# Patient Record
Sex: Male | Born: 1985 | Hispanic: No | Marital: Married | Smoking: Never smoker
Health system: Southern US, Community
[De-identification: ages and names within clinical notes are randomized; demographics above are authoritative.]

## PROBLEM LIST (undated history)

## (undated) DIAGNOSIS — K644 Residual hemorrhoidal skin tags: Secondary | ICD-10-CM

## (undated) HISTORY — DX: Residual hemorrhoidal skin tags: K64.4

---

## 2018-10-28 DIAGNOSIS — M62830 Muscle spasm of back: Secondary | ICD-10-CM | POA: Insufficient documentation

## 2018-12-07 ENCOUNTER — Emergency Department (HOSPITAL_COMMUNITY)
Admission: EM | Admit: 2018-12-07 | Discharge: 2018-12-07 | Disposition: A | Discharge status: Home / Self Care | Payer: Self-pay | Attending: Emergency Medicine | Admitting: Emergency Medicine

## 2018-12-07 DIAGNOSIS — R1013 Epigastric pain: Secondary | ICD-10-CM | POA: Insufficient documentation

## 2018-12-07 DIAGNOSIS — K644 Residual hemorrhoidal skin tags: Secondary | ICD-10-CM | POA: Insufficient documentation

## 2018-12-07 DIAGNOSIS — K625 Hemorrhage of anus and rectum: Secondary | ICD-10-CM | POA: Insufficient documentation

## 2018-12-07 LAB — CBC WITH DIFFERENTIAL/PLATELET
Abs Immature Granulocytes: 0.01 10*3/uL (ref 0.00–0.07)
Basophils Absolute: 0 10*3/uL (ref 0.0–0.1)
Basophils Relative: 1 %
Eosinophils Absolute: 0 10*3/uL (ref 0.0–0.5)
Eosinophils Relative: 1 %
HCT: 43.6 % (ref 39.0–52.0)
Hemoglobin: 14.3 g/dL (ref 13.0–17.0)
Immature Granulocytes: 0 %
Lymphocytes Relative: 26 %
Lymphs Abs: 1.5 10*3/uL (ref 0.7–4.0)
MCH: 29.8 pg (ref 26.0–34.0)
MCHC: 32.8 g/dL (ref 30.0–36.0)
MCV: 90.8 fL (ref 80.0–100.0)
Monocytes Absolute: 0.4 10*3/uL (ref 0.1–1.0)
Monocytes Relative: 6 %
Neutro Abs: 3.8 10*3/uL (ref 1.7–7.7)
Neutrophils Relative %: 66 %
Platelets: 195 10*3/uL (ref 150–400)
RBC: 4.8 MIL/uL (ref 4.22–5.81)
RDW: 12 % (ref 11.5–15.5)
WBC: 5.8 10*3/uL (ref 4.0–10.5)
nRBC: 0 % (ref 0.0–0.2)

## 2018-12-07 LAB — COMPREHENSIVE METABOLIC PANEL
ALT: 41 U/L (ref 0–44)
ANION GAP: 9 (ref 5–15)
AST: 28 U/L (ref 15–41)
Albumin: 4 g/dL (ref 3.5–5.0)
Alkaline Phosphatase: 59 U/L (ref 38–126)
BUN: 10 mg/dL (ref 6–20)
CO2: 25 mmol/L (ref 22–32)
Calcium: 9.3 mg/dL (ref 8.9–10.3)
Chloride: 104 mmol/L (ref 98–111)
Creatinine, Ser: 1.2 mg/dL (ref 0.61–1.24)
GFR calc Af Amer: >60 mL/min (ref >60)
GFR calc non Af Amer: >60 mL/min (ref >60)
Glucose, Bld: 126 mg/dL — ABNORMAL HIGH (ref 70–99)
Potassium: 3.7 mmol/L (ref 3.5–5.1)
SODIUM: 138 mmol/L (ref 135–145)
TOTAL PROTEIN: 6.8 g/dL (ref 6.5–8.1)
Total Bilirubin: 0.9 mg/dL (ref 0.3–1.2)

## 2018-12-07 LAB — POC OCCULT BLOOD, ED: Fecal Occult Bld: POSITIVE — AB

## 2018-12-07 LAB — TYPE AND SCREEN
ABO/RH(D): B POS
Antibody Screen: NEGATIVE

## 2018-12-07 LAB — ABO/RH: ABO/RH(D): B POS

## 2018-12-07 MED ORDER — PANTOPRAZOLE SODIUM 20 MG PO TBEC: 20.0000 mg | DELAYED_RELEASE_TABLET | Freq: Two times a day (BID) | ORAL | 0 refills | Status: AC

## 2018-12-07 NOTE — Discharge Instructions (Addendum)
It was my pleasure taking care of you today!   Please call the GI clinic listed first thing on Monday morning to schedule a follow-up appointment.

## 2018-12-07 NOTE — ED Provider Notes (Signed)
[facility] Provider Note   CSN: [id number] Arrival date & time: 12/07/18  1408     History   Chief Complaint Chief Complaint  Patient presents with  . GI Bleeding    HPI [facility] is a 33 y.o. male.  The history is provided by the patient and medical records. No language interpreter was used.   [facility] is a 33 y.o. male who presents to the Emergency Department complaining of bright red blood with bowel movements which began 4 to 5 days ago.  Patient stated that he initially high bright red blood that filled the toilet with a bowel movement on Tuesday.  He did not have any further bleeding until he had another bowel movement.  This morning, he had a third bowel movement which also had bright red blood and bleeding that filled the toilet.  He denies any bleeding outside of bowel movements.  History of similar a few years ago where he saw an urgent care and was given Preparation H cream to place around his rectum.  He did this and everything resolved.  He has never seen a GI doctor.  He tried Preparation H cream this episode, however with no relief.  He does report feeling a little dizzy when he stands up over the last 2 days.  No syncopal episodes.  No headache or visual changes.  He denies alcohol use.  No frequent NSAID use.  He does report epigastric pain without nausea or vomiting.  No past medical history on file.  There are no active problems to display for this patient.      Home Medications    Prior to Admission medications   Medication Sig Start Date End Date Taking? Authorizing Provider  pantoprazole (PROTONIX) 20 MG tablet Take 1 tablet (20 mg total) by mouth 2 (two) times daily. 12/07/18   , Chase Picket, PA-C    Family History No family history on file.  Social History Social History   Tobacco Use  . Smoking status: Not on file  Substance Use Topics  . Alcohol use: Not on file  . Drug  use: Not on file     Allergies   Patient has no allergy information on record.   Review of Systems Review of Systems  Gastrointestinal: Positive for abdominal pain and blood in stool. Negative for constipation, diarrhea, nausea, rectal pain and vomiting.  All other systems reviewed and are negative.    Physical Exam Updated Vital Signs BP (!) 133/94 (BP Location: Right Arm)   Pulse 70   Temp 98.2 F (36.8 C) (Oral)   Resp 18   Ht 5\' 9"  (1.753 m)   Wt 81.2 kg   SpO2 100%   BMI 26.43 kg/m   Physical Exam Vitals signs and nursing note reviewed.  Constitutional:      General: He is not in acute distress.    Appearance: He is well-developed.  HENT:     Head: Normocephalic and atraumatic.  Neck:     Musculoskeletal: Neck supple.  Cardiovascular:     Rate and Rhythm: Normal rate and regular rhythm.     Heart sounds: Normal heart sounds. No murmur.  Pulmonary:     Effort: Pulmonary effort is normal. No respiratory distress.     Breath sounds: Normal breath sounds.  Abdominal:     General: There is no distension.     Palpations: Abdomen is soft.     Comments: Tenderness to palpation of  the epigastrium without rebound or guarding. Negative Murphy's.   Genitourinary:    Comments: External hemorrhoids without tenderness.  No active bleeding on exam. Skin:    General: Skin is warm and dry.  Neurological:     Mental Status: He is alert and oriented to person, place, and time.      ED Treatments / Results  Labs (all labs ordered are listed, but only abnormal results are displayed) Labs Reviewed  COMPREHENSIVE METABOLIC PANEL - Abnormal; Notable for the following components:      Result Value   Glucose, Bld 126 (*)    All other components within normal limits  POC OCCULT BLOOD, ED - Abnormal; Notable for the following components:   Fecal Occult Bld POSITIVE (*)    All other components within normal limits  CBC WITH DIFFERENTIAL/PLATELET  OCCULT BLOOD X 1 CARD TO  LAB, STOOL  TYPE AND SCREEN  ABO/RH    EKG None  Radiology No results found.  Procedures Procedures (including critical care time)  Medications Ordered in ED Medications - No data to display   Initial Impression / Assessment and Plan / ED Course  I have reviewed the triage vital signs and the nursing notes.  Pertinent labs & imaging results that were available during my care of the patient were reviewed by me and considered in my medical decision making (see chart for details).    [facility] is a 33 y.o. male who presents to ED for bright red blood with bowel movements for the last several days.  On exam, patient is afebrile, hemodynamically stable with normal orthostatic vitals.  He does have some tenderness to the epigastrium, but no rebound or guarding.  Negative Murphy sign.  No active bleeding on rectal exam.  Hemoccult positive.  Hemoglobin is 14.3.  Patient appears stable for further work-up of his rectal bleeding as an outpatient.  Will refer to GI.  Strict return precautions were discussed with both patient and family.  All questions answered.  Patient discussed with Dr. Madilyn Hook who agrees with treatment plan.    Final Clinical Impressions(s) / ED Diagnoses   Final diagnoses:  Bright red blood per rectum  Epigastric pain    ED Discharge Orders         Ordered    pantoprazole (PROTONIX) 20 MG tablet  2 times daily     12/07/18 1538           , Chase Picket, PA-C 12/07/18 1556    Tilden Fossa, MD 12/09/18 [phone]

## 2018-12-07 NOTE — ED Notes (Signed)
Patient completed orthostatic vitals but complained of dizziness after standing for two minutes.

## 2018-12-07 NOTE — ED Triage Notes (Signed)
Pt reports that he has been having blood with bowel movements since Tuesday. Pt is also complaining of abdominal tenderness and some dizziness.

## 2018-12-07 NOTE — ED Notes (Signed)
Pt given discharge instructions. Pt verbalized understanding. Pt given prescription. Prescription reviewed with patient.

## 2019-05-29 ENCOUNTER — Emergency Department (HOSPITAL_COMMUNITY): Payer: BLUE CROSS/BLUE SHIELD

## 2019-05-29 ENCOUNTER — Other Ambulatory Visit: Payer: Self-pay

## 2019-05-29 ENCOUNTER — Encounter (HOSPITAL_COMMUNITY): Payer: Self-pay | Admitting: Emergency Medicine

## 2019-05-29 ENCOUNTER — Emergency Department (HOSPITAL_COMMUNITY)
Admission: EM | Admit: 2019-05-29 | Discharge: 2019-05-29 | Disposition: A | Discharge status: Home / Self Care | Payer: BLUE CROSS/BLUE SHIELD | Attending: Emergency Medicine | Admitting: Emergency Medicine

## 2019-05-29 DIAGNOSIS — R110 Nausea: Secondary | ICD-10-CM | POA: Diagnosis not present

## 2019-05-29 DIAGNOSIS — R51 Headache: Secondary | ICD-10-CM | POA: Insufficient documentation

## 2019-05-29 DIAGNOSIS — U071 COVID-19: Secondary | ICD-10-CM

## 2019-05-29 DIAGNOSIS — R0602 Shortness of breath: Secondary | ICD-10-CM | POA: Diagnosis not present

## 2019-05-29 DIAGNOSIS — Z20828 Contact with and (suspected) exposure to other viral communicable diseases: Secondary | ICD-10-CM | POA: Insufficient documentation

## 2019-05-29 LAB — CBC WITH DIFFERENTIAL/PLATELET
Abs Immature Granulocytes: 0.04 10*3/uL (ref 0.00–0.07)
Basophils Absolute: 0 10*3/uL (ref 0.0–0.1)
Basophils Relative: 0 %
Eosinophils Absolute: 0.1 10*3/uL (ref 0.0–0.5)
Eosinophils Relative: 1 %
HCT: 44.8 % (ref 39.0–52.0)
Hemoglobin: 15 g/dL (ref 13.0–17.0)
Immature Granulocytes: 0 %
Lymphocytes Relative: 20 %
Lymphs Abs: 1.8 10*3/uL (ref 0.7–4.0)
MCH: 30.3 pg (ref 26.0–34.0)
MCHC: 33.5 g/dL (ref 30.0–36.0)
MCV: 90.5 fL (ref 80.0–100.0)
Monocytes Absolute: 0.5 10*3/uL (ref 0.1–1.0)
Monocytes Relative: 6 %
Neutro Abs: 6.9 10*3/uL (ref 1.7–7.7)
Neutrophils Relative %: 73 %
Platelets: 219 10*3/uL (ref 150–400)
RBC: 4.95 MIL/uL (ref 4.22–5.81)
RDW: 12.4 % (ref 11.5–15.5)
WBC: 9.4 10*3/uL (ref 4.0–10.5)
nRBC: 0 % (ref 0.0–0.2)

## 2019-05-29 LAB — COMPREHENSIVE METABOLIC PANEL
ALT: 41 U/L (ref 0–44)
AST: 27 U/L (ref 15–41)
Albumin: 4.5 g/dL (ref 3.5–5.0)
Alkaline Phosphatase: 63 U/L (ref 38–126)
Anion gap: 10 (ref 5–15)
BUN: 13 mg/dL (ref 6–20)
CO2: 25 mmol/L (ref 22–32)
Calcium: 9.9 mg/dL (ref 8.9–10.3)
Chloride: 103 mmol/L (ref 98–111)
Creatinine, Ser: 0.96 mg/dL (ref 0.61–1.24)
GFR calc Af Amer: >60 mL/min (ref >60)
GFR calc non Af Amer: >60 mL/min (ref >60)
Glucose, Bld: 103 mg/dL — ABNORMAL HIGH (ref 70–99)
Potassium: 4.3 mmol/L (ref 3.5–5.1)
Sodium: 138 mmol/L (ref 135–145)
Total Bilirubin: 0.8 mg/dL (ref 0.3–1.2)
Total Protein: 7.8 g/dL (ref 6.5–8.1)

## 2019-05-29 IMAGING — DX PORTABLE CHEST - 1 VIEW
1 series · 1 of 1 positions shown · non-contrast
Comparison: None.

CLINICAL DATA: Shortness of breath and fever.  Fatigue.

EXAM:
PORTABLE CHEST 1 VIEW

[chest ap]
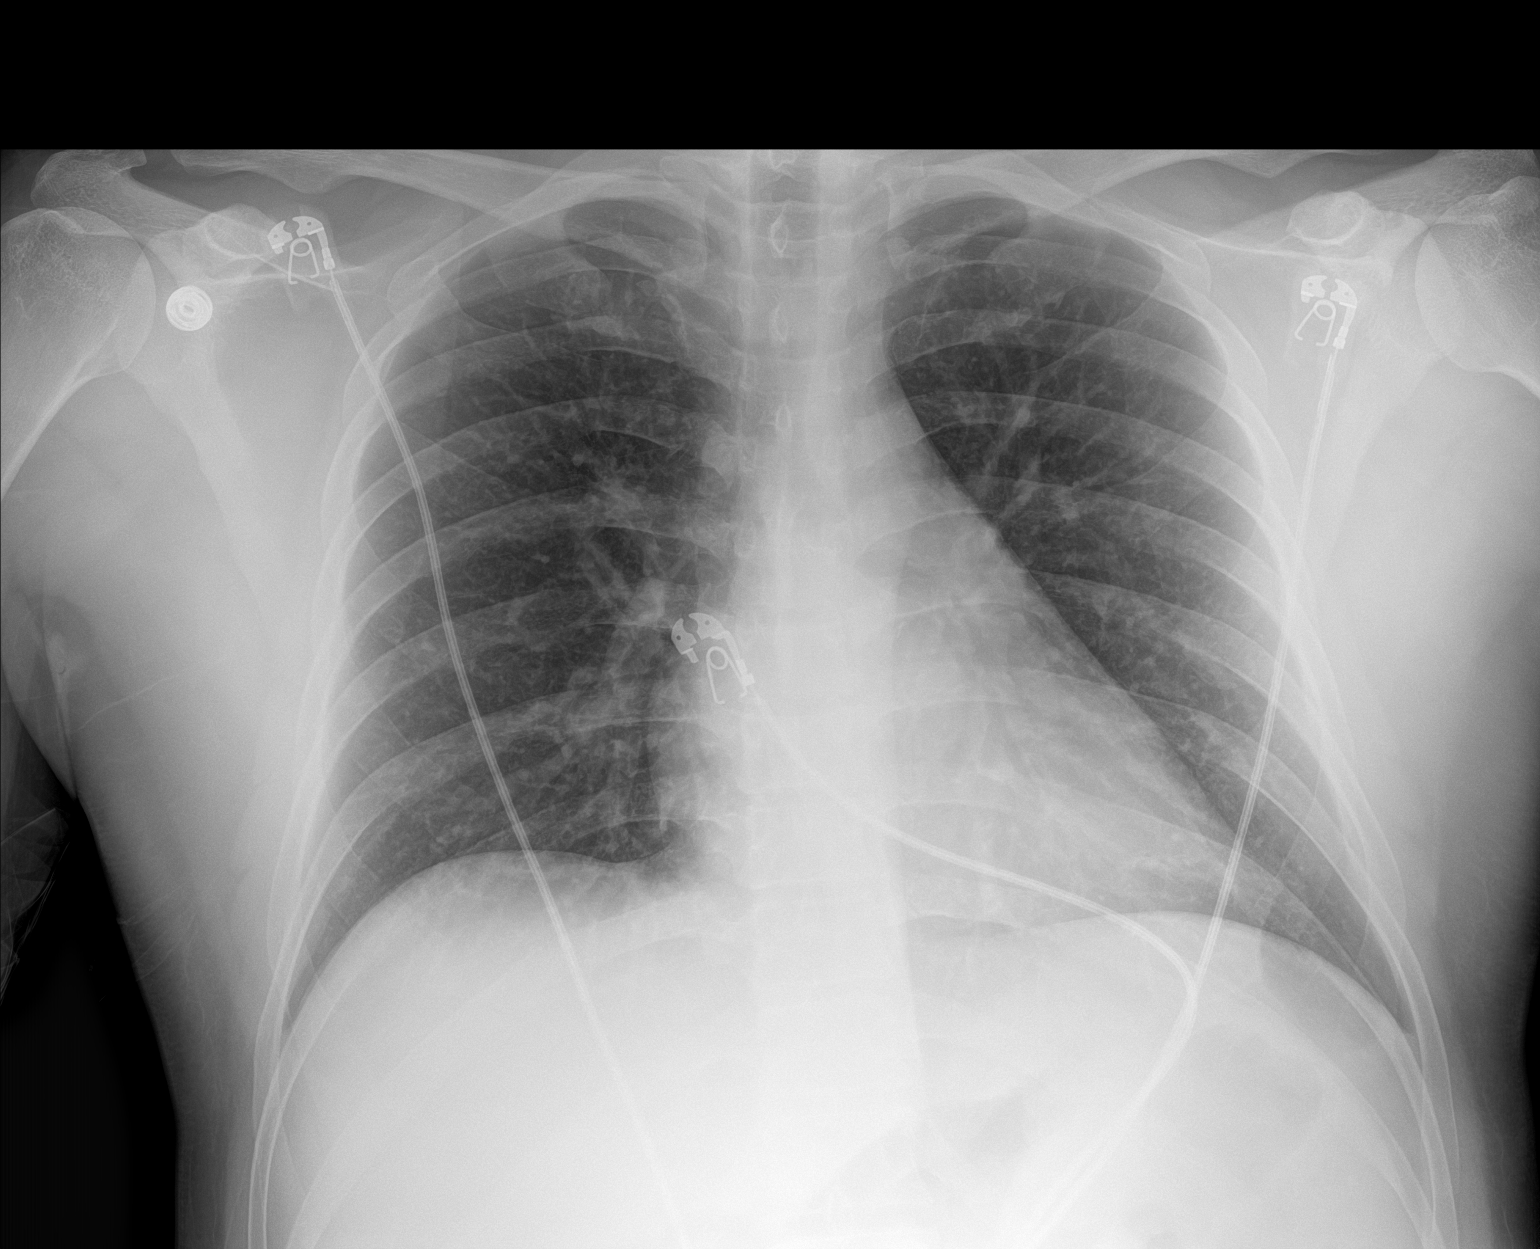

[1 of 1 positions shown; findings below may reference images not displayed]

FINDINGS: The heart size and mediastinal contours are within normal limits.
Both lungs are clear. The visualized skeletal structures are
unremarkable.
IMPRESSION: No active disease.

## 2019-05-29 MED ORDER — ACETAMINOPHEN 325 MG PO TABS
650.0000 mg | ORAL_TABLET | Freq: Once | ORAL | Status: AC
  Administered 2019-05-29: 650 mg via ORAL
  Filled 2019-05-29: qty 2

## 2019-05-29 NOTE — ED Provider Notes (Signed)
[facility] Provider Note   CSN: [id number] Arrival date & time: 05/29/19  5397    History   Chief Complaint Chief Complaint  Patient presents with  . COVID  . Shortness of Breath    HPI Mukund Rowen Wilmer is a 33 y.o. male.     33 y.o male with no PMH presents to the ED with a chief complaint of shortness of breath, headache, myalgias x today. Patient reports he was at work when the symptoms began, he also endorses nausea along with feeling overall weak. He also reported shortness of breath with sudden onset. Patient is currently employed in [employer] at a [employer]. He report having his temperature recorded with a Tmax of 100. He denies any headache, chest pain or other complaints.   The history is provided by the patient.  Shortness of Breath Associated symptoms: no abdominal pain, no chest pain, no fever and no wheezing      Home Medications    Prior to Admission medications   Medication Sig Start Date End Date Taking? Authorizing Provider  pantoprazole (PROTONIX) 20 MG tablet Take 1 tablet (20 mg total) by mouth 2 (two) times daily. 12/07/18   Ward, Ozella Almond, PA-C    Family History No family history on file.  Social History Social History   Tobacco Use  . Smoking status: Not on file  Substance Use Topics  . Alcohol use: Not on file  . Drug use: Not on file     Allergies   Patient has no known allergies.   Review of Systems Review of Systems  Constitutional: Negative for fever.  Respiratory: Positive for shortness of breath. Negative for wheezing.   Cardiovascular: Negative for chest pain.  Gastrointestinal: Negative for abdominal pain.  Musculoskeletal: Positive for myalgias.     Physical Exam Updated Vital Signs BP 123/89   Pulse 66   Temp 98.5 F (36.9 C) (Oral)   Resp 16   SpO2 99%   Physical Exam Vitals signs and nursing note reviewed.  Constitutional:    Appearance: He is well-developed.     Comments: No ill appearing.   HENT:     Head: Normocephalic and atraumatic.  Eyes:     General: No scleral icterus.    Pupils: Pupils are equal, round, and reactive to light.  Neck:     Musculoskeletal: Normal range of motion.  Cardiovascular:     Heart sounds: Normal heart sounds.  Pulmonary:     Effort: Pulmonary effort is normal.     Breath sounds: Normal breath sounds. No wheezing, rhonchi or rales.     Comments: No wheezing, no rhonchi, no rales.  Chest:     Chest wall: No tenderness.  Abdominal:     General: Bowel sounds are normal. There is no distension.     Palpations: Abdomen is soft.     Tenderness: There is no abdominal tenderness.  Musculoskeletal:        General: No tenderness or deformity.  Skin:    General: Skin is warm and dry.  Neurological:     Mental Status: He is alert and oriented to person, place, and time.      ED Treatments / Results  Labs (all labs ordered are listed, but only abnormal results are displayed) Labs Reviewed  COMPREHENSIVE METABOLIC PANEL - Abnormal; Notable for the following components:      Result Value   Glucose, Bld 103 (*)    All other  components within normal limits  NOVEL CORONAVIRUS, NAA (HOSPITAL ORDER, SEND-OUT TO REF LAB)  CBC WITH DIFFERENTIAL/PLATELET    EKG None  Radiology Dg Chest Portable 1 View  Result Date: 05/29/2019 CLINICAL DATA:  Shortness of breath and fever.  Fatigue. EXAM: PORTABLE CHEST 1 VIEW COMPARISON:  None. FINDINGS: The heart size and mediastinal contours are within normal limits. Both lungs are clear. The visualized skeletal structures are unremarkable. IMPRESSION: No active disease. Electronically Signed   By: Paulina FusiMark  Shogry M.D.   On: 05/29/2019 20:15    Procedures Procedures (including critical care time)  Medications Ordered in ED Medications  acetaminophen (TYLENOL) tablet 650 mg (650 mg Oral Given 05/29/19 2011)     Initial Impression /  Assessment and Plan / ED Course  I have reviewed the triage vital signs and the nursing notes.  Pertinent labs & imaging results that were available during my care of the patient were reviewed by me and considered in my medical decision making (see chart for details).      Patient with no PMH presents to the ED with complaints of shortness of breath, headache, nausea which suddenly began around 4 Pm he was at work, patient is currently employed at a [employer].  Reports he had a temperature measure with a T-max of 100.  Upon arrival patient arrived in the ED febrile at 101 F, oxygen saturation at 100% on RA, no acute respiratory distress.  CBC with no leukocytosis, hemoglobin 15. CMP showed no electrolyte abnormality. Creatine level is within normal limits. LFT's with no unremarkable changes.  Xray showed no No active disease. Covid 19 test obtained as patient symptoms are consistent with likely a covid 19 infection.  10:52 PM Patient reevaluated by me, he is afebrile, reports he is feeling better with tylenol will obtain swab for send out. Patient is advised to quarantine while at his test results return. Vitals within normal limits, xray without any pneumonia. Return precautions discussed at length.     Raheem Birdie SonsBahdadur Ehlert was evaluated in Emergency Department on 05/29/2019 for the symptoms described in the history of present illness. He was evaluated in the context of the global COVID-19 pandemic, which necessitated consideration that the patient might be at risk for infection with the SARS-CoV-2 virus that causes COVID-19. Institutional protocols and algorithms that pertain to the evaluation of patients at risk for COVID-19 are in a state of rapid change based on information released by regulatory bodies including the CDC and federal and state organizations. These policies and algorithms were followed during the patient's care in the ED.  Portions of this note were generated  with Scientist, clinical (histocompatibility and immunogenetics)Dragon dictation software. Dictation errors may occur despite best attempts at proofreading.    Final Clinical Impressions(s) / ED Diagnoses   Final diagnoses:  Shortness of breath  COVID-19    ED Discharge Orders    None       Claude MangesSoto, Dimetri Armitage, PA-C 05/29/19 2255    Sabas SousBero, Michael M, MD 06/02/19 [phone]

## 2019-05-29 NOTE — ED Triage Notes (Signed)
Pt works in [employer]. PT has fatigue, shortness of breath and nausea with a fever. Speaks nepali, interpreter used. No med hx of allergies.

## 2019-05-29 NOTE — Discharge Instructions (Addendum)
Today you were tested for Covid 19 if your results are positive you will be called, please take tylenol to help with your fever. Your laboratory results along with your chest xray were within normal limits. Please return if you experience any worsening symptoms or shortness of breath.

## 2019-05-31 LAB — NOVEL CORONAVIRUS, NAA (HOSP ORDER, SEND-OUT TO REF LAB; TAT 18-24 HRS): SARS-CoV-2, NAA: NOT DETECTED

## 2019-06-18 ENCOUNTER — Other Ambulatory Visit: Payer: Self-pay

## 2019-06-18 DIAGNOSIS — Z20822 Contact with and (suspected) exposure to covid-19: Secondary | ICD-10-CM

## 2019-06-20 LAB — NOVEL CORONAVIRUS, NAA: SARS-CoV-2, NAA: NOT DETECTED

## 2019-07-30 ENCOUNTER — Other Ambulatory Visit: Payer: Self-pay

## 2019-07-30 ENCOUNTER — Encounter (HOSPITAL_COMMUNITY): Payer: Self-pay

## 2019-07-30 ENCOUNTER — Ambulatory Visit (HOSPITAL_COMMUNITY)
Admission: EM | Admit: 2019-07-30 | Discharge: 2019-07-30 | Disposition: A | Discharge status: Home / Self Care | Payer: BLUE CROSS/BLUE SHIELD | Attending: Family Medicine | Admitting: Family Medicine

## 2019-07-30 DIAGNOSIS — M5442 Lumbago with sciatica, left side: Secondary | ICD-10-CM

## 2019-07-30 DIAGNOSIS — R202 Paresthesia of skin: Secondary | ICD-10-CM

## 2019-07-30 DIAGNOSIS — R200 Anesthesia of skin: Secondary | ICD-10-CM

## 2019-07-30 MED ORDER — CYCLOBENZAPRINE HCL 5 MG PO TABS: 5.0000 mg | ORAL_TABLET | Freq: Every day | ORAL | 0 refills | Status: DC

## 2019-07-30 MED ORDER — GABAPENTIN 100 MG PO CAPS: 100.0000 mg | ORAL_CAPSULE | Freq: Three times a day (TID) | ORAL | 0 refills | Status: DC

## 2019-07-30 MED ORDER — METHYLPREDNISOLONE SODIUM SUCC 125 MG IJ SOLR
125.0000 mg | Freq: Once | INTRAMUSCULAR | Status: AC
  Administered 2019-07-30: 125 mg via INTRAMUSCULAR

## 2019-07-30 MED ORDER — MELOXICAM 7.5 MG PO TABS: 7.5000 mg | ORAL_TABLET | Freq: Every day | ORAL | 0 refills | Status: DC

## 2019-07-30 MED ORDER — METHYLPREDNISOLONE SODIUM SUCC 125 MG IJ SOLR
INTRAMUSCULAR | Status: AC
  Filled 2019-07-30: qty 2

## 2019-07-30 NOTE — ED Provider Notes (Signed)
  MRN: [id number] DOB: 26-Aug-1986  Subjective:   [facility] is a 33 y.o. male presenting for 1 day history of acute onset moderate-severe worsening constant shooting pain of left low back/buttock/hip. It started shooting down his left thigh into his foot and had associated numbness and tingling. He was at work when it started hurting, is working at [employer]. No inciting events, falls or trauma. His work is strenuous however.  Reports history of similar episode a few years ago, had to stay out of work for 2 weeks.  No current facility-administered medications for this encounter.   Current Outpatient Medications:  .  pantoprazole (PROTONIX) 20 MG tablet, Take 1 tablet (20 mg total) by mouth 2 (two) times daily., Disp: 30 tablet, Rfl: 0   No Known Allergies  History reviewed. No pertinent past medical history.   History reviewed. No pertinent surgical history.  ROS A fever, difficulty urinating, inability to defecate, incontinence, hematuria, weakness.  Objective:   Vitals: BP 127/80 (BP Location: Right Arm)   Pulse 70   Temp 98.3 F (36.8 C) (Oral)   Resp 18   Wt 182 lb (82.6 kg)   SpO2 100%   BMI 26.88 kg/m   Physical Exam Constitutional:      Appearance: Normal appearance. He is well-developed and normal weight.  HENT:     Head: Normocephalic and atraumatic.     Right Ear: External ear normal.     Left Ear: External ear normal.     Nose: Nose normal.     Mouth/Throat:     Pharynx: Oropharynx is clear.  Eyes:     Extraocular Movements: Extraocular movements intact.     Pupils: Pupils are equal, round, and reactive to light.  Cardiovascular:     Rate and Rhythm: Normal rate.  Pulmonary:     Effort: Pulmonary effort is normal.  Musculoskeletal:     Lumbar back: He exhibits decreased range of motion (Flexion, extension severely limited), tenderness (Positive straight leg raise to the left, area outlined is demarcating pain) and spasm. He exhibits no bony  tenderness, no swelling, no edema, no deformity and no laceration.       Back:  Skin:    General: Skin is warm and dry.  Neurological:     Mental Status: He is alert and oriented to person, place, and time.     Coordination: Coordination abnormal (Favoring left side, inability to sit up straight secondary to pain).     Deep Tendon Reflexes: Reflexes normal.  Psychiatric:        Mood and Affect: Mood normal.        Behavior: Behavior normal.     Assessment and Plan :   1. Acute left-sided low back pain with left-sided sciatica   2. Numbness and tingling of left leg    Will treat aggressively with IM Solu-Medrol in clinic.  Patient is to use combination of meloxicam, gabapentin and Flexeril as an outpatient.  Counseled patient on need for rest from work.  ER precautions reviewed extensively.  Counseled patient he may need MRI if he worsens as well.  Patient will pursue this through occupational health or his PCP. Counseled patient on potential for adverse effects with medications prescribed/recommended today, ER and return-to-clinic precautions discussed, patient verbalized understanding.      Jaynee Eagles, PA-C 07/30/19 2015

## 2019-07-30 NOTE — ED Triage Notes (Signed)
Pt state she has left hip and foot pain x 4 days ago.

## 2019-11-03 ENCOUNTER — Other Ambulatory Visit: Payer: Self-pay

## 2019-11-03 DIAGNOSIS — Z20822 Contact with and (suspected) exposure to covid-19: Secondary | ICD-10-CM

## 2019-11-04 LAB — NOVEL CORONAVIRUS, NAA: SARS-CoV-2, NAA: NOT DETECTED

## 2020-10-23 ENCOUNTER — Ambulatory Visit (HOSPITAL_COMMUNITY)
Admission: EM | Admit: 2020-10-23 | Discharge: 2020-10-23 | Disposition: A | Discharge status: Home / Self Care | Payer: 59 | Attending: Physician Assistant | Admitting: Physician Assistant

## 2020-10-23 ENCOUNTER — Other Ambulatory Visit: Payer: Self-pay

## 2020-10-23 ENCOUNTER — Encounter (HOSPITAL_COMMUNITY): Payer: Self-pay

## 2020-10-23 DIAGNOSIS — S39012A Strain of muscle, fascia and tendon of lower back, initial encounter: Secondary | ICD-10-CM | POA: Diagnosis not present

## 2020-10-23 DIAGNOSIS — M5431 Sciatica, right side: Secondary | ICD-10-CM | POA: Diagnosis not present

## 2020-10-23 MED ORDER — CYCLOBENZAPRINE HCL 5 MG PO TABS: 5.0000 mg | ORAL_TABLET | Freq: Three times a day (TID) | ORAL | 0 refills | Status: DC | PRN

## 2020-10-23 MED ORDER — MELOXICAM 7.5 MG PO TABS: 7.5000 mg | ORAL_TABLET | Freq: Every day | ORAL | 0 refills | Status: DC

## 2020-10-23 MED ORDER — PREDNISONE 10 MG PO TABS: 20.0000 mg | ORAL_TABLET | Freq: Every day | ORAL | 0 refills | Status: AC

## 2020-10-23 MED ORDER — KETOROLAC TROMETHAMINE 30 MG/ML IJ SOLN
30.0000 mg | Freq: Once | INTRAMUSCULAR | Status: AC
  Administered 2020-10-23: 30 mg via INTRAMUSCULAR

## 2020-10-23 MED ORDER — KETOROLAC TROMETHAMINE 30 MG/ML IJ SOLN
INTRAMUSCULAR | Status: AC
  Filled 2020-10-23: qty 1

## 2020-10-23 NOTE — Discharge Instructions (Addendum)
Take medications as prescribed.  Recommend ice to affected area Recommend light stretching and walking as tolerated.  Can take Meloxicam after completing the prednisone.

## 2020-10-23 NOTE — ED Provider Notes (Signed)
[facility]    CSN: [id number] Arrival date & time: 10/23/20  1700      History   Chief Complaint Chief Complaint  Patient presents with   Back Pain    HPI [facility] is a 34 y.o. male.   Pt complains of lower back pain with radiation to right leg that started about 4 hours ago after lifting a heavy box while at work, reports the box weighed about 40lbs. Denies numbness and tingling.  Reports radiation of pain to bilateral posterior legs to the knees. He has taken tylenol with minimal relief.  He reports pain is worse with walking. He reports similar pain in the past.  Denies saddle anesthesia.  Ambulating with pain.      History reviewed. No pertinent past medical history.  There are no problems to display for this patient.   History reviewed. No pertinent surgical history.     Home Medications    Prior to Admission medications   Medication Sig Start Date End Date Taking? Authorizing Provider  cyclobenzaprine (FLEXERIL) 5 MG tablet Take 1 tablet (5 mg total) by mouth at bedtime. 07/30/19   Wallis Bamberg, PA-C  gabapentin (NEURONTIN) 100 MG capsule Take 1 capsule (100 mg total) by mouth 3 (three) times daily. 07/30/19   Wallis Bamberg, PA-C  meloxicam (MOBIC) 7.5 MG tablet Take 1 tablet (7.5 mg total) by mouth daily. 07/30/19   Wallis Bamberg, PA-C  pantoprazole (PROTONIX) 20 MG tablet Take 1 tablet (20 mg total) by mouth 2 (two) times daily. 12/07/18   Ward, Chase Picket, PA-C    Family History History reviewed. No pertinent family history.  Social History Social History   Tobacco Use   Smoking status: Never Smoker   Smokeless tobacco: Never Used  Substance Use Topics   Alcohol use: Never   Drug use: Never     Allergies   Patient has no known allergies.   Review of Systems Review of Systems  Constitutional: Negative for chills and fever.  HENT: Negative for ear pain and sore throat.   Eyes: Negative for pain and visual disturbance.   Respiratory: Negative for cough and shortness of breath.   Cardiovascular: Negative for chest pain and palpitations.  Gastrointestinal: Negative for abdominal pain and vomiting.  Genitourinary: Negative for dysuria and hematuria.  Musculoskeletal: Positive for back pain. Negative for arthralgias.  Skin: Negative for color change and rash.  Neurological: Negative for seizures, syncope, weakness and numbness.  All other systems reviewed and are negative.    Physical Exam Triage Vital Signs ED Triage Vitals  Enc Vitals Group     BP 10/23/20 1742 108/78     Pulse Rate 10/23/20 1742 74     Resp 10/23/20 1742 20     Temp 10/23/20 1742 98 F (36.7 C)     Temp Source 10/23/20 1742 Oral     SpO2 10/23/20 1742 99 %     Weight --      Height --      Head Circumference --      Peak Flow --      Pain Score 10/23/20 1746 6     Pain Loc --      Pain Edu? --      Excl. in GC? --    No data found.  Updated Vital Signs BP 108/78 (BP Location: Right Arm)    Pulse 74    Temp 98 F (36.7 C) (Oral)    Resp 20  SpO2 99%   Visual Acuity Right Eye Distance:   Left Eye Distance:   Bilateral Distance:    Right Eye Near:   Left Eye Near:    Bilateral Near:     Physical Exam Vitals and nursing note reviewed.  Constitutional:      Appearance: He is well-developed and well-nourished.  HENT:     Head: Normocephalic and atraumatic.  Eyes:     Conjunctiva/sclera: Conjunctivae normal.  Cardiovascular:     Rate and Rhythm: Normal rate and regular rhythm.     Heart sounds: No murmur heard.   Pulmonary:     Effort: Pulmonary effort is normal. No respiratory distress.     Breath sounds: Normal breath sounds.  Abdominal:     Palpations: Abdomen is soft.     Tenderness: There is no abdominal tenderness.  Musculoskeletal:        General: No edema.     Cervical back: Neck supple.     Lumbar back: Spasms and tenderness present. Normal range of motion. Positive right straight leg raise  test. Negative left straight leg raise test.     Comments: Pt appear uncomfortable on exam.  Skin:    General: Skin is warm and dry.  Neurological:     Mental Status: He is alert.  Psychiatric:        Mood and Affect: Mood and affect normal.      UC Treatments / Results  Labs (all labs ordered are listed, but only abnormal results are displayed) Labs Reviewed - No data to display  EKG   Radiology No results found.  Procedures Procedures (including critical care time)  Medications Ordered in UC Medications - No data to display  Initial Impression / Assessment and Plan / UC Course  I have reviewed the triage vital signs and the nursing notes.  Pertinent labs & imaging results that were available during my care of the patient were reviewed by me and considered in my medical decision making (see chart for details).     Lower back spasm with radiculopathy to right lower extremity. Toradol given in clinic today with some improvement.  Prednisone prescribed.  Flexeril prescribed.  He will take Mobic after completing prednisone.  Pt stable at discharge. Return precautions discussed.  Final Clinical Impressions(s) / UC Diagnoses   Final diagnoses:  None   Discharge Instructions   None    ED Prescriptions    None     PDMP not reviewed this encounter.   Jodell Cipro, PA-C 11/01/20 1811

## 2020-10-23 NOTE — ED Triage Notes (Signed)
Pt presents with back pain 4 hrs. States he was at work lifting heavy boxes  (40 lbs) and started feeling a shooting pain in the right side of the back, tingling sensation in right leg.

## 2021-07-11 ENCOUNTER — Ambulatory Visit (HOSPITAL_COMMUNITY)
Admission: EM | Admit: 2021-07-11 | Discharge: 2021-07-11 | Disposition: A | Discharge status: Home / Self Care | Payer: 59 | Attending: Family Medicine | Admitting: Family Medicine

## 2021-07-11 ENCOUNTER — Encounter (HOSPITAL_COMMUNITY): Payer: Self-pay

## 2021-07-11 ENCOUNTER — Other Ambulatory Visit: Payer: Self-pay

## 2021-07-11 DIAGNOSIS — M5431 Sciatica, right side: Secondary | ICD-10-CM | POA: Diagnosis not present

## 2021-07-11 MED ORDER — PREDNISONE 20 MG PO TABS: 40.0000 mg | ORAL_TABLET | Freq: Every day | ORAL | 0 refills | Status: DC

## 2021-07-11 NOTE — ED Provider Notes (Signed)
[facility]    CSN: [id number] Arrival date & time: 07/11/21  1704      History   Chief Complaint Chief Complaint  Patient presents with   Leg Pain    HPI Nicholas Stafford is a 35 y.o. male.   Patient here today with over a week of right-sided buttock pain running down the back of his leg toward his knee, worse with weightbearing and movement.  Denies significant weakness, numbness, tingling, bowel or bladder incontinence, known back injuries.  States he was seen by his primary care provider almost a week ago, given Flexeril which has not been helping at all.  Has had this issue before in the past.   History reviewed. No pertinent past medical history.  There are no problems to display for this patient.   History reviewed. No pertinent surgical history.     Home Medications    Prior to Admission medications   Medication Sig Start Date End Date Taking? Authorizing Provider  predniSONE (DELTASONE) 20 MG tablet Take 2 tablets (40 mg total) by mouth daily with breakfast. 07/11/21  Yes Particia Nearing, PA-C  cyclobenzaprine (FLEXERIL) 5 MG tablet Take 1 tablet (5 mg total) by mouth 3 (three) times daily as needed for muscle spasms. 10/23/20   Ward, Tylene Fantasia, PA-C  gabapentin (NEURONTIN) 100 MG capsule Take 1 capsule (100 mg total) by mouth 3 (three) times daily. 07/30/19   Wallis Bamberg, PA-C  meloxicam (MOBIC) 7.5 MG tablet Take 1 tablet (7.5 mg total) by mouth daily. 10/23/20   Ward, Tylene Fantasia, PA-C  pantoprazole (PROTONIX) 20 MG tablet Take 1 tablet (20 mg total) by mouth 2 (two) times daily. 12/07/18   Ward, Chase Picket, PA-C    Family History Family History  Family history unknown: Yes    Social History Social History   Tobacco Use   Smoking status: Never   Smokeless tobacco: Never  Substance Use Topics   Alcohol use: Never   Drug use: Never     Allergies   Patient has no known allergies.   Review of Systems Review of Systems Per  HPI  Physical Exam Triage Vital Signs ED Triage Vitals  Enc Vitals Group     BP 07/11/21 1731 116/74     Pulse Rate 07/11/21 1731 61     Resp 07/11/21 1731 18     Temp 07/11/21 1731 98.3 F (36.8 C)     Temp Source 07/11/21 1731 Oral     SpO2 07/11/21 1731 98 %     Weight --      Height --      Head Circumference --      Peak Flow --      Pain Score 07/11/21 1733 8     Pain Loc --      Pain Edu? --      Excl. in GC? --    No data found.  Updated Vital Signs BP 116/74 (BP Location: Right Arm)   Pulse 61   Temp 98.3 F (36.8 C) (Oral)   Resp 18   SpO2 98%   Visual Acuity Right Eye Distance:   Left Eye Distance:   Bilateral Distance:    Right Eye Near:   Left Eye Near:    Bilateral Near:     Physical Exam Vitals and nursing note reviewed.  Constitutional:      Appearance: Normal appearance.  HENT:     Head: Atraumatic.  Eyes:     Extraocular  Movements: Extraocular movements intact.     Conjunctiva/sclera: Conjunctivae normal.  Cardiovascular:     Rate and Rhythm: Normal rate and regular rhythm.  Pulmonary:     Effort: Pulmonary effort is normal.     Breath sounds: Normal breath sounds.  Musculoskeletal:        General: Normal range of motion.     Cervical back: Normal range of motion and neck supple.     Comments: No midline spinal tenderness palpation diffusely.  Negative straight leg raise bilateral lower extremities.  Normal gait.  Strength full and equal bilateral lower extremities.  Posterior and lateral right buttock tender to palpation running down the hamstring.  Skin:    General: Skin is warm and dry.     Findings: No erythema.  Neurological:     General: No focal deficit present.     Mental Status: He is oriented to person, place, and time.     Comments: Right lower extremity neurovascular intact  Psychiatric:        Mood and Affect: Mood normal.        Thought Content: Thought content normal.        Judgment: Judgment normal.     UC  Treatments / Results  Labs (all labs ordered are listed, but only abnormal results are displayed) Labs Reviewed - No data to display  EKG   Radiology No results found.  Procedures Procedures (including critical care time)  Medications Ordered in UC Medications - No data to display  Initial Impression / Assessment and Plan / UC Course  I have reviewed the triage vital signs and the nursing notes.  Pertinent labs & imaging results that were available during my care of the patient were reviewed by me and considered in my medical decision making (see chart for details).     We will add prednisone to Flexeril, stretches, Epsom salt soaks, rest.  Follow-up with PCP for recheck next week.  Return for worsening symptoms.  Final Clinical Impressions(s) / UC Diagnoses   Final diagnoses:  Sciatica of right side   Discharge Instructions   None    ED Prescriptions     Medication Sig Dispense Auth. Provider   predniSONE (DELTASONE) 20 MG tablet Take 2 tablets (40 mg total) by mouth daily with breakfast. 10 tablet Particia Nearing, [place]      PDMP not reviewed this encounter.   Particia Nearing, [place] 07/11/21 [phone]

## 2021-07-11 NOTE — ED Triage Notes (Signed)
Pt presents with right side pain from hip radiating down leg X 1 week that is unrelieved with prescribed medication.

## 2021-09-26 DIAGNOSIS — M25531 Pain in right wrist: Secondary | ICD-10-CM | POA: Insufficient documentation

## 2021-09-26 DIAGNOSIS — K644 Residual hemorrhoidal skin tags: Secondary | ICD-10-CM | POA: Insufficient documentation

## 2021-09-26 DIAGNOSIS — E663 Overweight: Secondary | ICD-10-CM | POA: Insufficient documentation

## 2021-10-03 DIAGNOSIS — R7401 Elevation of levels of liver transaminase levels: Secondary | ICD-10-CM | POA: Insufficient documentation

## 2021-10-03 DIAGNOSIS — E785 Hyperlipidemia, unspecified: Secondary | ICD-10-CM | POA: Insufficient documentation

## 2021-12-01 ENCOUNTER — Encounter: Payer: Self-pay | Admitting: Internal Medicine

## 2022-01-02 ENCOUNTER — Encounter: Payer: Self-pay | Admitting: Internal Medicine

## 2022-01-03 ENCOUNTER — Encounter: Payer: BLUE CROSS/BLUE SHIELD | Admitting: Internal Medicine

## 2022-01-03 NOTE — Progress Notes (Unsigned)
Patient showed up to visit and found out that [facility] is out-of-network for his insurance provider. Patient did not want to proceed with the visit if he had to pay out-of-pocket.

## 2022-02-24 ENCOUNTER — Emergency Department (HOSPITAL_COMMUNITY)
Admission: EM | Admit: 2022-02-24 | Discharge: 2022-02-24 | Disposition: A | Discharge status: Home / Self Care | Payer: BLUE CROSS/BLUE SHIELD | Attending: Emergency Medicine | Admitting: Emergency Medicine

## 2022-02-24 ENCOUNTER — Other Ambulatory Visit: Payer: Self-pay

## 2022-02-24 ENCOUNTER — Encounter (HOSPITAL_COMMUNITY): Payer: Self-pay | Admitting: Emergency Medicine

## 2022-02-24 DIAGNOSIS — M545 Low back pain, unspecified: Secondary | ICD-10-CM | POA: Insufficient documentation

## 2022-02-24 MED ORDER — DIAZEPAM 5 MG PO TABS
5.0000 mg | ORAL_TABLET | Freq: Once | ORAL | Status: AC
  Administered 2022-02-24: 5 mg via ORAL
  Filled 2022-02-24: qty 1

## 2022-02-24 MED ORDER — KETOROLAC TROMETHAMINE 15 MG/ML IJ SOLN
15.0000 mg | Freq: Once | INTRAMUSCULAR | Status: AC
  Administered 2022-02-24: 15 mg via INTRAMUSCULAR
  Filled 2022-02-24: qty 1

## 2022-02-24 MED ORDER — OXYCODONE HCL 5 MG PO TABS
5.0000 mg | ORAL_TABLET | Freq: Once | ORAL | Status: AC
  Administered 2022-02-24: 5 mg via ORAL
  Filled 2022-02-24: qty 1

## 2022-02-24 MED ORDER — ACETAMINOPHEN 500 MG PO TABS
1000.0000 mg | ORAL_TABLET | Freq: Once | ORAL | Status: AC
  Administered 2022-02-24: 1000 mg via ORAL
  Filled 2022-02-24: qty 2

## 2022-02-24 NOTE — Discharge Instructions (Addendum)
Your back pain is most likely due to a muscular strain.  There is been a lot of research on back pain, unfortunately the only thing that seems to really help is Tylenol and ibuprofen.  Relative rest is also important to not lift greater than 10 pounds bending or twisting at the waist.  Please follow-up with your family physician.  The other thing that really seems to benefit patients is physical therapy which your doctor may send you for.  Please return to the emergency department for new numbness or weakness to your arms or legs. Difficulty with urinating or urinating or pooping on yourself.  Also if you cannot feel toilet paper when you wipe or get a fever.  ? ?Take 4 over the counter ibuprofen tablets 3 times a day or 2 over-the-counter naproxen tablets twice a day for pain. ?Also take tylenol 1000mg (2 extra strength) four times a day.  ? ?[email or web address] ? ??????? ??? ?????? ?????? ???????? ?????? ?????? ?????? ??? ??????? ???? ????????? ???? ?, ??????????? ?? ????? ??? ??? ???????? ????? ????? Tylenol ? ibuprofen ??? ??????? ???? ??? 10 ?????? ????? ??? ??????? ??????? ?? ??????? ??? ????? ???????????? ?? ????? ????? ????????? ??????????? ????? ?????????? ????? ???? ??? ???????? ???????????? ????? ????? ????? ??????? ????? ?????? ?? ??? ??????? ???????? ???????? ????? ????? ????? ????? ??? ?? ???????? ???? ????? ?? ???????? ???? ???????? ??????? ??????????? ????? ????? ?? ????? ????? ?? ?????? ????? ???? ??????? ???? ??? ?????? ???????? ??????? ?????? ?? ????? ????? ????? ???? ?????????? ???? ? ???????? ???? 4 ???-?-??????? ??????????? ???????????? ????? 3 ??? ?? 2 ???-?-??????? ?????????? ???????????? ????? ??? ??? ????????? ????? tylenol 1000mg  (2 ???????? ?????) ????? ??? ??? ????????? ? ?

## 2022-02-24 NOTE — ED Provider Triage Note (Signed)
Emergency Medicine Provider Triage Evaluation Note ? ?Nicholas Stafford & Minor , a 36 y.o. male  was evaluated in triage.  Pt complains of back pain for the past 2-3 weeks. The patient was seen at urgent care initially and received an injection that he reports mild relief from. Denies new injury, denies urinary and stool incontinence. Denies any urinary retention or saddle anesthesia. Pain with lifting leg. Denies any IVDU. ? ?Translation services used for this encounter.  ? ?Review of Systems  ?Positive: Back pain ?Negative: See above ? ?Physical Exam  ?BP 113/73 (BP Location: Left Arm)   Pulse 77   Temp 98.5 ?F (36.9 ?C) (Oral)   Resp 16   SpO2 100%  ?Gen:   Awake, no distress   ?Resp:  Normal effort  ?MSK:   Moves extremities without difficulty  ?Other:  Equal strength and sensation in lower legs.  ? ?Medical Decision Making  ?Medically screening exam initiated at 12:27 PM.  Appropriate orders placed.  Nicholas Stafford was informed that the remainder of the evaluation will be completed by another provider, this initial triage assessment does not replace that evaluation, and the importance of remaining in the ED until their evaluation is complete. ? ?Vital signs normal. NAD. Likely siatica. Will order Toradol injection here.  ?  ?Achille Rich, PA-C ?02/24/22 1233 ? ?

## 2022-02-24 NOTE — ED Notes (Signed)
Patient discharge instructions reviewed with the patient. The patient verbalized understanding of instructions. Patient discharged. 

## 2022-02-24 NOTE — ED Provider Notes (Signed)
?[facility] ?Provider Note ? ? ?CSN: [id number] ?Arrival date & time: 02/24/22  1126 ? ?  ? ?History ? ?Chief Complaint  ?Patient presents with  ? Back Pain  ? ? ?Nicholas Stafford is a 36 y.o. male. ? ?36 yo M with a chief complaints of left-sided low back pain.  The patient has been having issues with this off and on.  3 weeks ago he had similar symptoms that have gotten better however he has had worsening in the past couple days.  He tells me that he has had no injury no loss of bowel or bladder no loss of peritoneal sensation no weakness or numbness to the legs.  He denies any trauma. ? ? ?Back Pain ? ?  ? ?Home Medications ?Prior to Admission medications   ?Medication Sig Start Date End Date Taking? Authorizing Provider  ?cyclobenzaprine (FLEXERIL) 5 MG tablet Take 1 tablet (5 mg total) by mouth 3 (three) times daily as needed for muscle spasms. 10/23/20   Ward, Lenise Arena, PA-C  ?gabapentin (NEURONTIN) 100 MG capsule Take 1 capsule (100 mg total) by mouth 3 (three) times daily. 07/30/19   Jaynee Eagles, PA-C  ?meloxicam (MOBIC) 7.5 MG tablet Take 1 tablet (7.5 mg total) by mouth daily. 10/23/20   Ward, Lenise Arena, PA-C  ?pantoprazole (PROTONIX) 20 MG tablet Take 1 tablet (20 mg total) by mouth 2 (two) times daily. 12/07/18   Ward, Ozella Almond, PA-C  ?predniSONE (DELTASONE) 20 MG tablet Take 2 tablets (40 mg total) by mouth daily with breakfast. 07/11/21   Volney American, PA-C  ?   ? ?Allergies    ?Patient has no known allergies.   ? ?Review of Systems   ?Review of Systems  ?Musculoskeletal:  Positive for back pain.  ? ?Physical Exam ?Updated Vital Signs ?BP 113/73 (BP Location: Left Arm)   Pulse 77   Temp 98.5 ?F (36.9 ?C) (Oral)   Resp 16   SpO2 100%  ?Physical Exam ?Vitals and nursing note reviewed.  ?Constitutional:   ?   Appearance: He is well-developed.  ?HENT:  ?   Head: Normocephalic and atraumatic.  ?Eyes:  ?   Pupils: Pupils are equal, round, and reactive  to light.  ?Neck:  ?   Vascular: No JVD.  ?Cardiovascular:  ?   Rate and Rhythm: Normal rate and regular rhythm.  ?   Heart sounds: No murmur heard. ?  No friction rub. No gallop.  ?Pulmonary:  ?   Effort: No respiratory distress.  ?   Breath sounds: No wheezing.  ?Abdominal:  ?   General: There is no distension.  ?   Tenderness: There is no abdominal tenderness. There is no guarding or rebound.  ?Musculoskeletal:     ?   General: Tenderness present. Normal range of motion.  ?   Cervical back: Normal range of motion and neck supple.  ?   Comments: Mild tenderness about the left-sided paraspinal musculature.  Signs that the patient has recently had cupping.  No midline spinal tenderness step-offs or deformities.  Pulse motor and sensation intact to bilateral lower extremities.  Reflexes are 2+ and equal.  No clonus.  ?Skin: ?   Coloration: Skin is not pale.  ?   Findings: No rash.  ?Neurological:  ?   Mental Status: He is alert and oriented to person, place, and time.  ?Psychiatric:     ?   Behavior: Behavior normal.  ? ? ?ED Results /  Procedures / Treatments   ?Labs ?(all labs ordered are listed, but only abnormal results are displayed) ?Labs Reviewed - No data to display ? ?EKG ?None ? ?Radiology ?No results found. ? ?Procedures ?Procedures  ? ? ?Medications Ordered in ED ?Medications  ?acetaminophen (TYLENOL) tablet 1,000 mg (has no administration in time range)  ?oxyCODONE (Oxy IR/ROXICODONE) immediate release tablet 5 mg (has no administration in time range)  ?diazepam (VALIUM) tablet 5 mg (has no administration in time range)  ?ketorolac (TORADOL) 15 MG/ML injection 15 mg (15 mg Intramuscular Given 02/24/22 1245)  ? ? ?ED Course/ Medical Decision Making/ A&P ?  ?                        ?Medical Decision Making ?Risk ?OTC drugs. ?Prescription drug management. ? ? ?36 yo M with a chief complaint of low back pain.  Unfortunate the patient has had issues with this for the past couple years.  Atraumatic back pain no  fevers no red flags.  Has a benign exam here.  Ambulates with some discomfort.  Patient is requesting advanced imaging here.  I discussed with him that typically this gets followed up as an outpatient.  Recommend that he follow-up with his family doctor. ? ?He tells me that even this been going on for a year he has not followed with his family doctor for this.  Says like the patient was well and then had cupping performed and had some worsening of his pain.  Told that this is not uncommon. ? ?2:26 PM:  I have discussed the diagnosis/risks/treatment options with the patient.  Evaluation and diagnostic testing in the emergency department does not suggest an emergent condition requiring admission or immediate intervention beyond what has been performed at this time.  They will follow up with  PCP, sports med. We also discussed returning to the ED immediately if new or worsening sx occur. We discussed the sx which are most concerning (e.g., sudden worsening pain, fever, inability to tolerate by mouth, cauda equina s/sx) that necessitate immediate return. Medications administered to the patient during their visit and any new prescriptions provided to the patient are listed below. ? ?Medications given during this visit ?Medications  ?acetaminophen (TYLENOL) tablet 1,000 mg (has no administration in time range)  ?oxyCODONE (Oxy IR/ROXICODONE) immediate release tablet 5 mg (has no administration in time range)  ?diazepam (VALIUM) tablet 5 mg (has no administration in time range)  ?ketorolac (TORADOL) 15 MG/ML injection 15 mg (15 mg Intramuscular Given 02/24/22 1245)  ? ? ? ?The patient appears reasonably screen and/or stabilized for discharge and I doubt any other medical condition or other [facility] requiring further screening, evaluation, or treatment in the ED at this time prior to discharge.  ? ? ? ? ? ? ? ? ?Final Clinical Impression(s) / ED Diagnoses ?Final diagnoses:  ?Acute left-sided low back pain without sciatica  ? ? ?Rx  / DC Orders ?ED Discharge Orders   ? ? None  ? ?  ? ? ?  ?Deno Etienne, DO ?02/24/22 1426 ? ?

## 2022-02-24 NOTE — ED Triage Notes (Signed)
Patient here with complaint of left lower back pain. Patient states he was seen at urgent  care for same two weeks ago and received an injection that helped his pain but states pain returned yesterday. Denies new injury, denies urinary and stool incontinence, patient is alert and oriented and in no apparent distress. ?

## 2022-03-11 ENCOUNTER — Emergency Department (HOSPITAL_COMMUNITY): Payer: BLUE CROSS/BLUE SHIELD

## 2022-03-11 ENCOUNTER — Other Ambulatory Visit: Payer: Self-pay

## 2022-03-11 ENCOUNTER — Emergency Department (HOSPITAL_COMMUNITY)
Admission: EM | Admit: 2022-03-11 | Discharge: 2022-03-12 | Disposition: A | Discharge status: Home / Self Care | Payer: BLUE CROSS/BLUE SHIELD | Attending: Emergency Medicine | Admitting: Emergency Medicine

## 2022-03-11 ENCOUNTER — Encounter (HOSPITAL_COMMUNITY): Payer: Self-pay

## 2022-03-11 DIAGNOSIS — M5126 Other intervertebral disc displacement, lumbar region: Secondary | ICD-10-CM | POA: Diagnosis not present

## 2022-03-11 DIAGNOSIS — R200 Anesthesia of skin: Secondary | ICD-10-CM | POA: Diagnosis not present

## 2022-03-11 DIAGNOSIS — M545 Low back pain, unspecified: Secondary | ICD-10-CM

## 2022-03-11 LAB — URINALYSIS, ROUTINE W REFLEX MICROSCOPIC
Bilirubin Urine: NEGATIVE
Glucose, UA: NEGATIVE mg/dL
Hgb urine dipstick: NEGATIVE
Ketones, ur: NEGATIVE mg/dL
Leukocytes,Ua: NEGATIVE
Nitrite: NEGATIVE
Protein, ur: NEGATIVE mg/dL
Specific Gravity, Urine: 1.004 — ABNORMAL LOW (ref 1.005–1.030)
pH: 6 (ref 5.0–8.0)

## 2022-03-11 IMAGING — CR DG LUMBAR SPINE COMPLETE 4+V
5 series · 5 of 5 positions shown · non-contrast
Comparison: None.

CLINICAL DATA: Back pain

EXAM:
LUMBAR SPINE - COMPLETE 4+ VIEW

[l-spine ap]
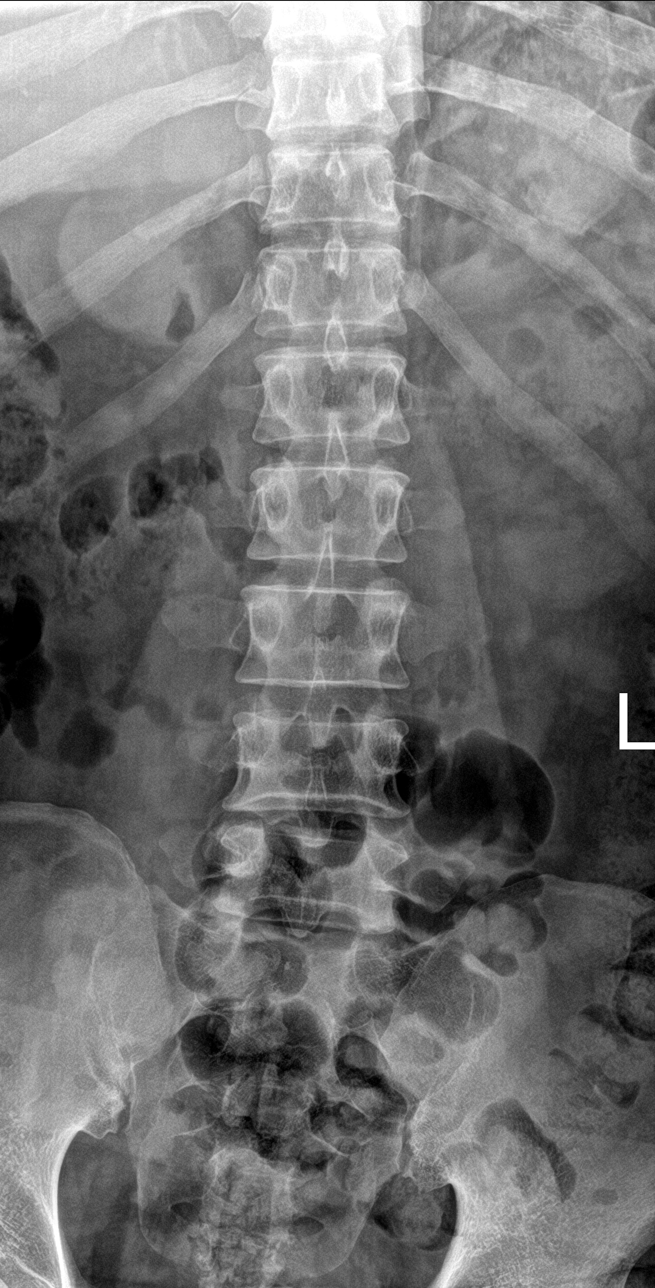

[l-spine obl (1 of 2)]
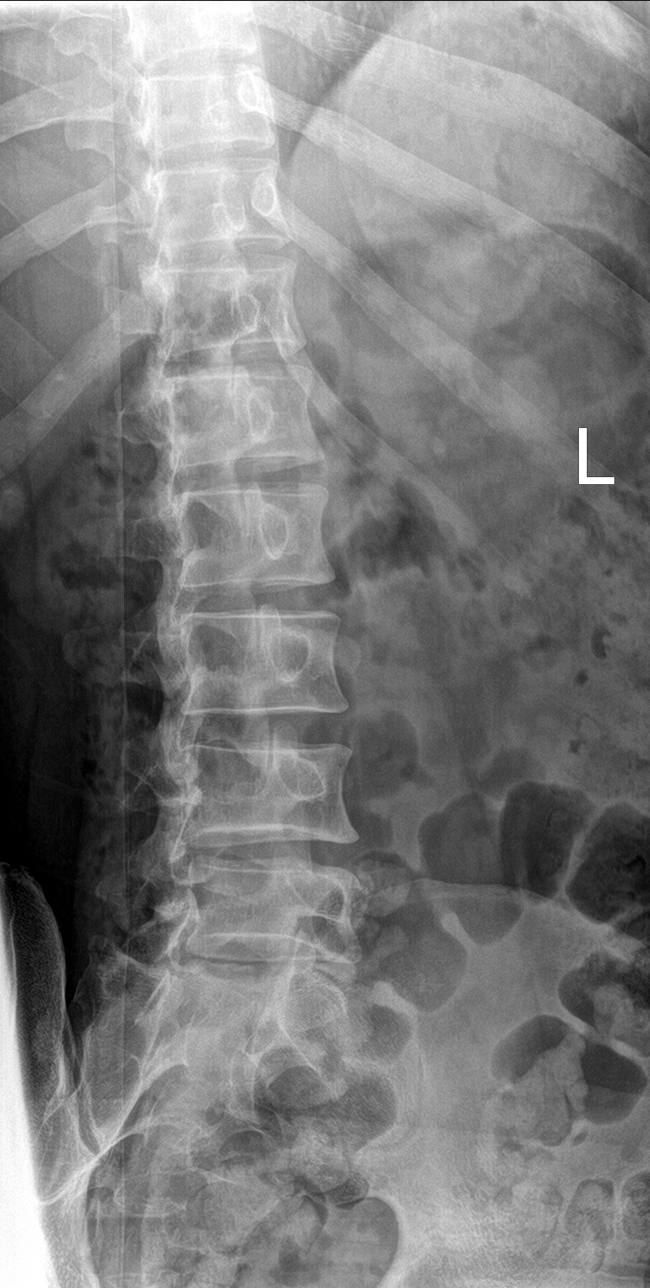

[l-spine obl (2 of 2)]
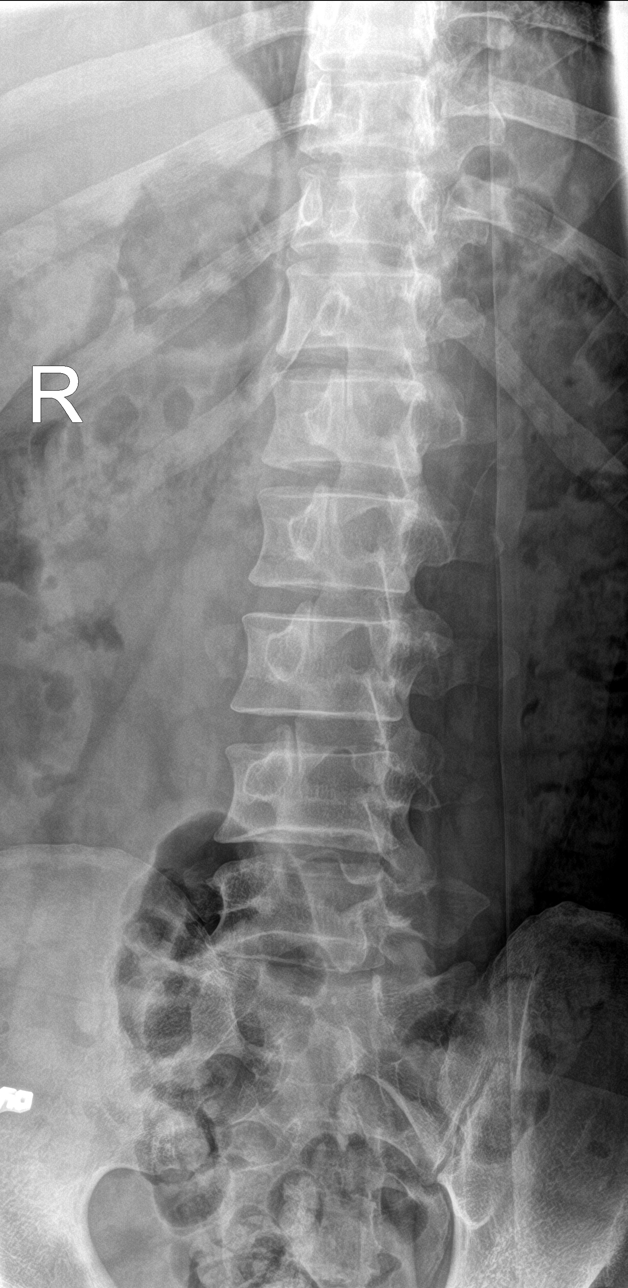

[l-spine lat]
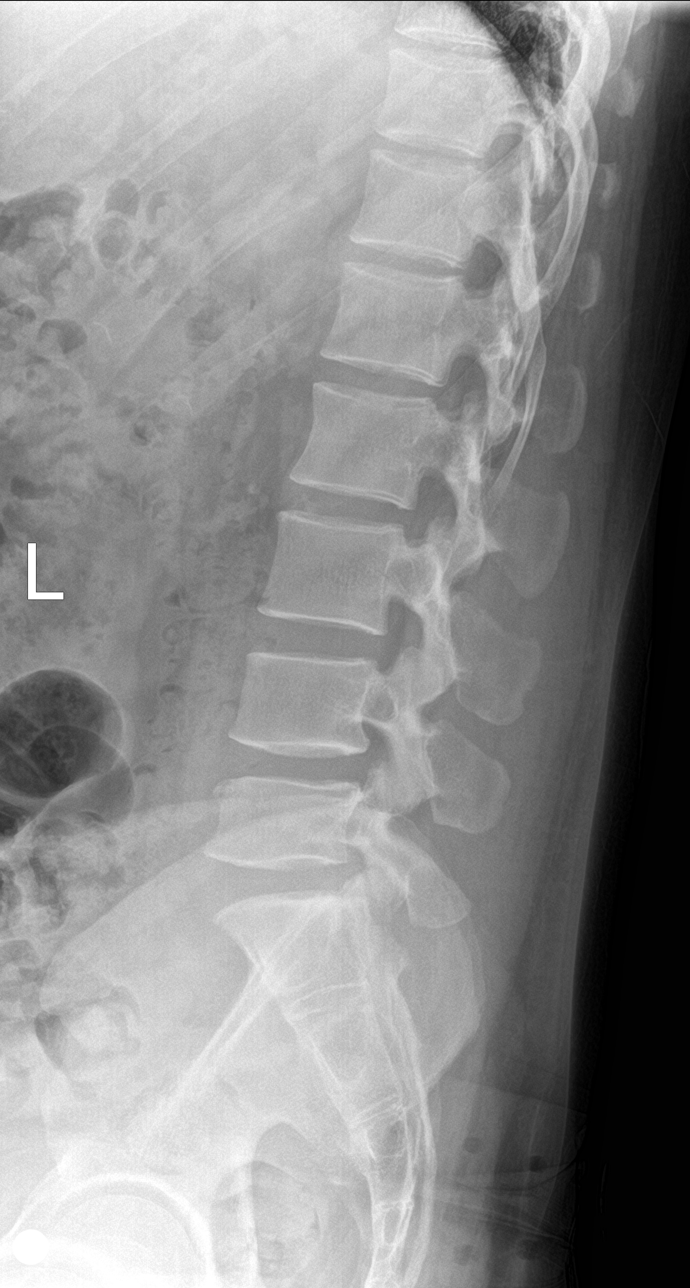

[l-spine spot]
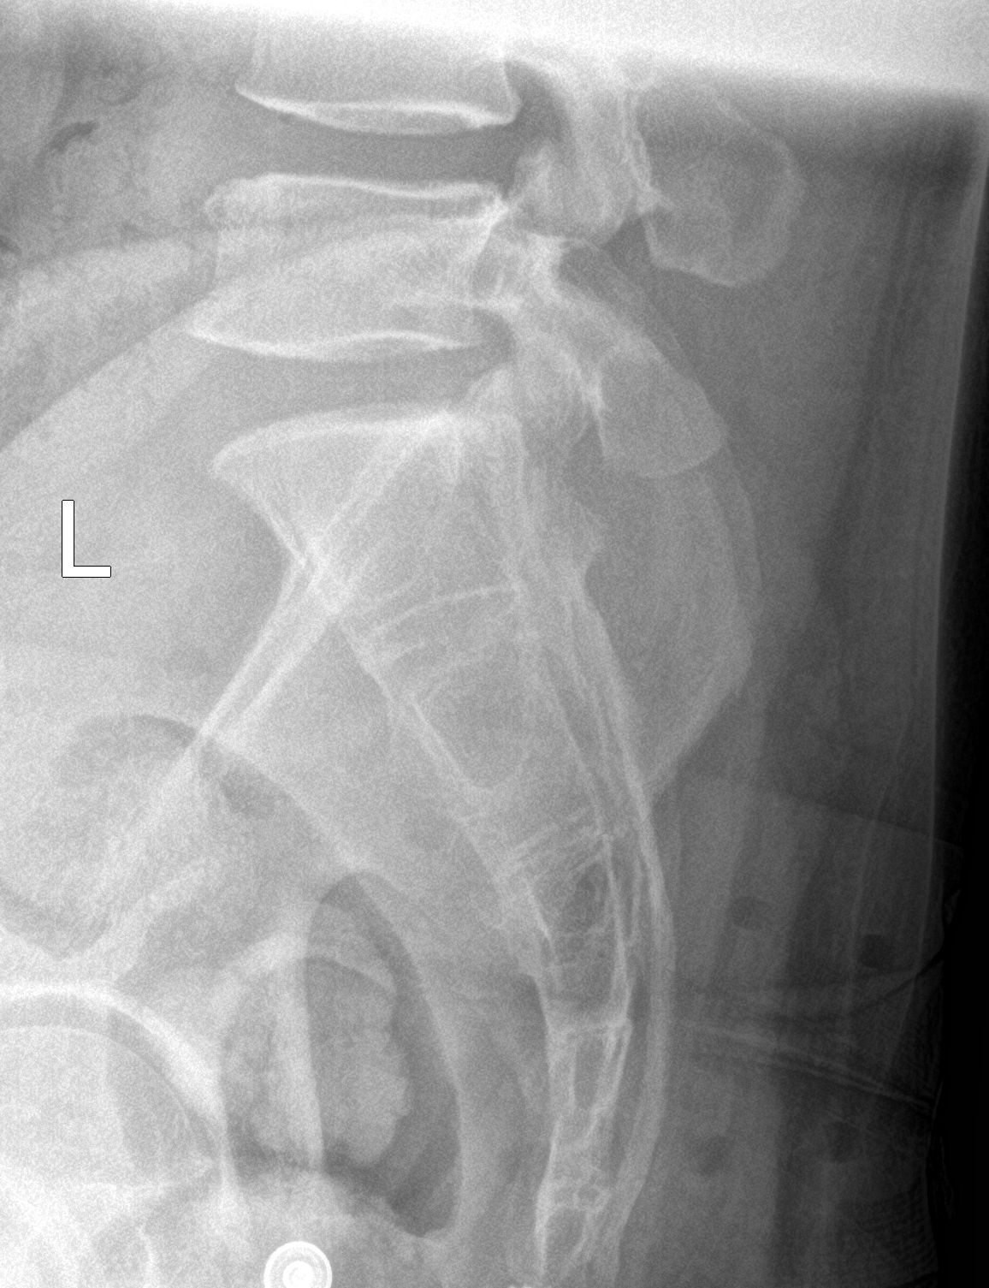

[5 of 5 positions shown; findings below may reference images not displayed]

FINDINGS: Height loss at L5 is likely chronic. There are 5 lumbar vertebral
bodies. Alignment is normal.
IMPRESSION: Likely chronic L5 height loss.  Normal alignment.

## 2022-03-11 NOTE — ED Provider Triage Note (Signed)
Emergency Medicine Provider Triage Evaluation Note ? ?Sigfredo Owens & Minor , a 36 y.o. male  was evaluated in triage.  Pt complains of persistent lower back pain radiating to left lower leg x 1 year, worsening over the past 1 month. He also complains of mild dysuria. He was seen in the ED on 04/07; no imaging provided at that time. He was discharged at that time and advised to follow up with PCP for further eval. He reports his insurance changed and he has not had a chance to do so.  ? ?Review of Systems  ?Positive: + back pain, left leg pain, dysuria ?Negative: - saddle anesthesia, urinary rentention, urinary or bowel incontinence ? ?Physical Exam  ?BP 103/76 (BP Location: Right Arm)   Pulse 69   Temp 98.5 ?F (36.9 ?C) (Oral)   Resp 18   SpO2 97%  ?Gen:   Awake, no distress   ?Resp:  Normal effort  ?MSK:   Moves extremities without difficulty  ?Other:   ? ?Medical Decision Making  ?Medically screening exam initiated at 10:40 PM.  Appropriate orders placed.  Keldrick Eldridge Marcott was informed that the remainder of the evaluation will be completed by another provider, this initial triage assessment does not replace that evaluation, and the importance of remaining in the ED until their evaluation is complete. ? ? ?  ?Tanda Rockers, PA-C ?03/11/22 2245 ? ?

## 2022-03-11 NOTE — ED Triage Notes (Signed)
Pt complains of persistent lower back pain radiating to left lower leg x 1 year, worsening over the past 1 month. He also complains of mild dysuria. He was seen in the ED on 04/07; no imaging provided at that time. He was discharged at that time and advised to follow up with PCP for further eval. He reports his insurance changed and he has not had a chance to do so.  ?  ?

## 2022-03-12 ENCOUNTER — Emergency Department (HOSPITAL_COMMUNITY): Payer: BLUE CROSS/BLUE SHIELD

## 2022-03-12 LAB — CBC
HCT: 43.3 % (ref 39.0–52.0)
Hemoglobin: 14.7 g/dL (ref 13.0–17.0)
MCH: 30.2 pg (ref 26.0–34.0)
MCHC: 33.9 g/dL (ref 30.0–36.0)
MCV: 88.9 fL (ref 80.0–100.0)
Platelets: 217 10*3/uL (ref 150–400)
RBC: 4.87 MIL/uL (ref 4.22–5.81)
RDW: 12.1 % (ref 11.5–15.5)
WBC: 7.9 10*3/uL (ref 4.0–10.5)
nRBC: 0 % (ref 0.0–0.2)

## 2022-03-12 LAB — BASIC METABOLIC PANEL
Anion gap: 8 (ref 5–15)
BUN: 8 mg/dL (ref 6–20)
CO2: 25 mmol/L (ref 22–32)
Calcium: 9.3 mg/dL (ref 8.9–10.3)
Chloride: 103 mmol/L (ref 98–111)
Creatinine, Ser: 1.03 mg/dL (ref 0.61–1.24)
GFR, Estimated: >60 mL/min (ref >60)
Glucose, Bld: 106 mg/dL — ABNORMAL HIGH (ref 70–99)
Potassium: 4.6 mmol/L (ref 3.5–5.1)
Sodium: 136 mmol/L (ref 135–145)

## 2022-03-12 IMAGING — MR MR THORACIC SPINE W/O CM
5 of 7 series · 25 of 48 positions shown · non-contrast
Comparison: None.

CLINICAL DATA: Acute lumbar myelopathy. Persisting lower back pain
radiating to the left lower leg for 1 year. Mild dysuria. Rule out
myelopathy

EXAM:
MRI CERVICAL, THORACIC AND LUMBAR SPINE WITHOUT CONTRAST
TECHNIQUE: Multiplanar and multiecho pulse sequences of the cervical spine, to
include the craniocervical junction and cervicothoracic junction,
and thoracic and lumbar spine, were obtained without intravenous
contrast.

[Series 16: T1 · sagittal · 3.0mm · 0.62mm/px · 2 of 9 slices shown (1 of 3)]
[im 1/9]
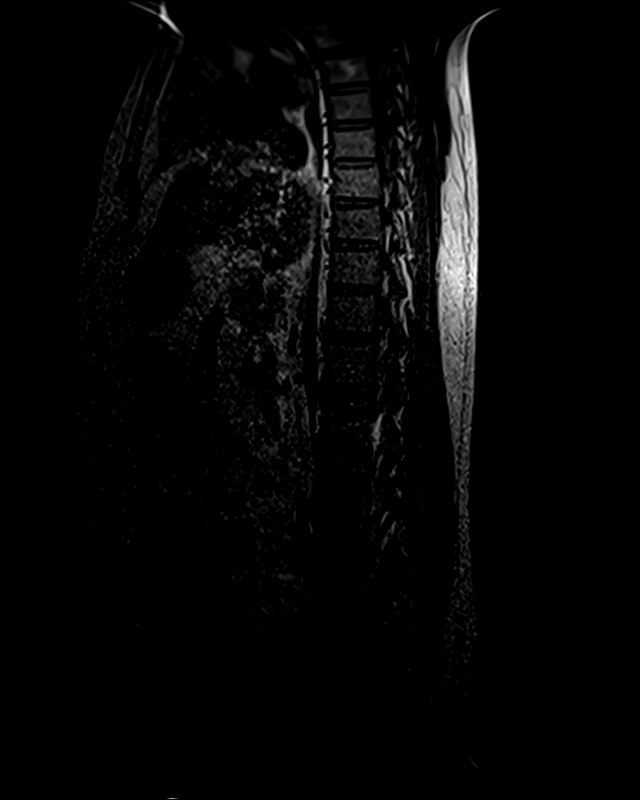
[im 9/9]
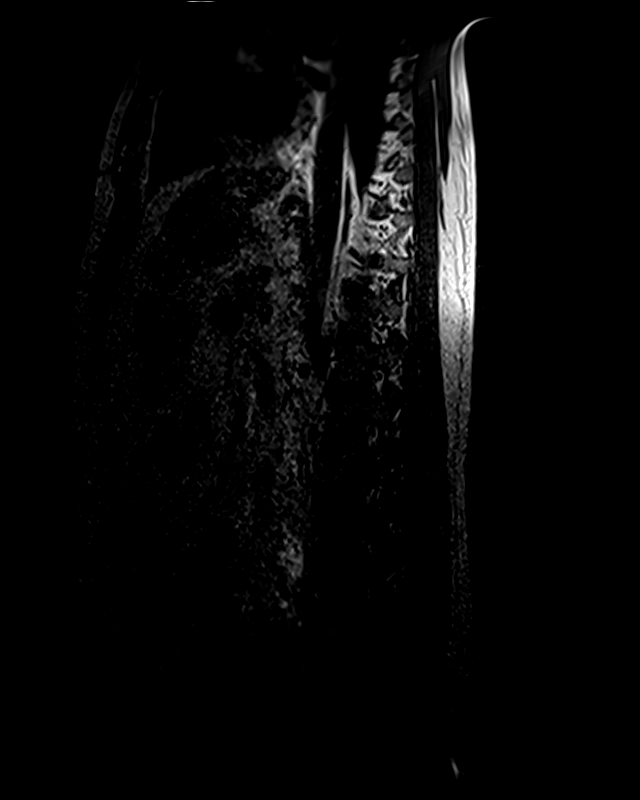

[Series 17: T1 · sagittal · 3.0mm · 0.62mm/px · 2 of 9 slices shown (2 of 3)]
[im 1/9]
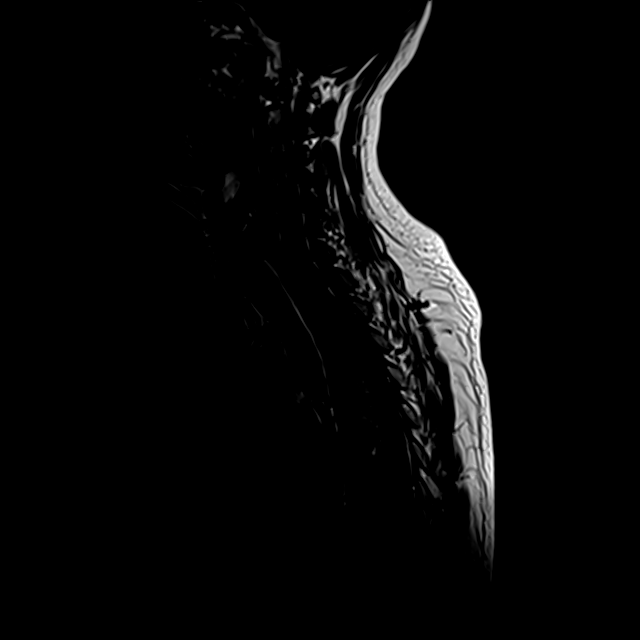
[im 9/9]
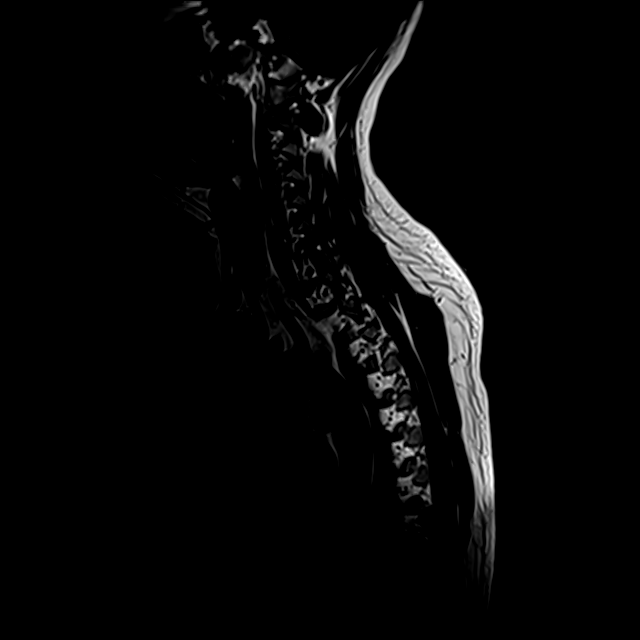

[Series 19: T2 · sagittal · 3.0mm · 0.89mm/px · 6 of 17 slices shown (1 of 2)]
[im 1/17]
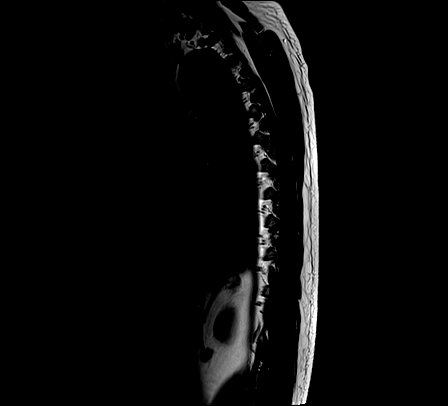
[im 4/17]
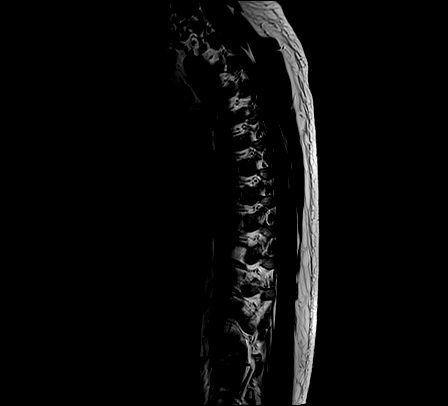
[im 7/17]
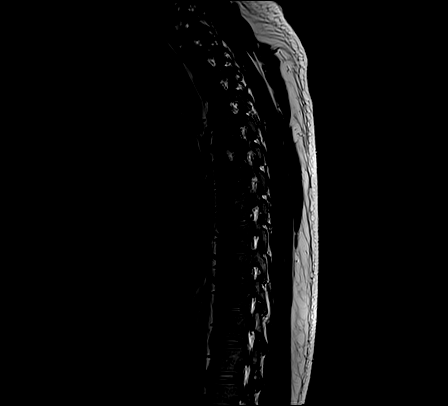
[im 10/17]
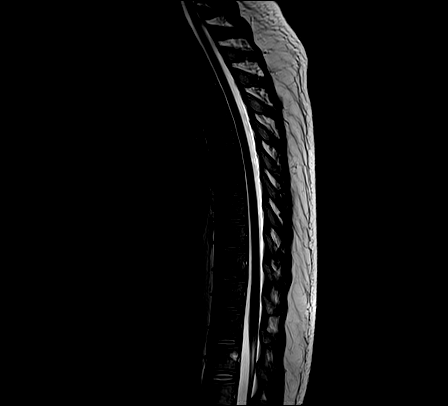
[im 13/17]
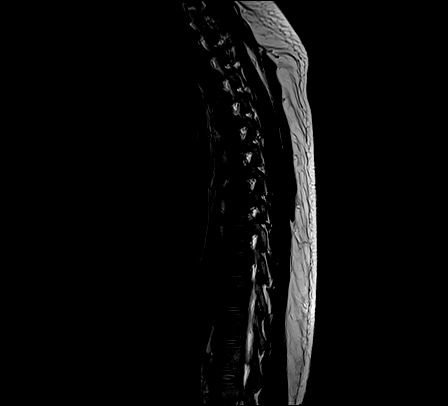
[im 17/17]
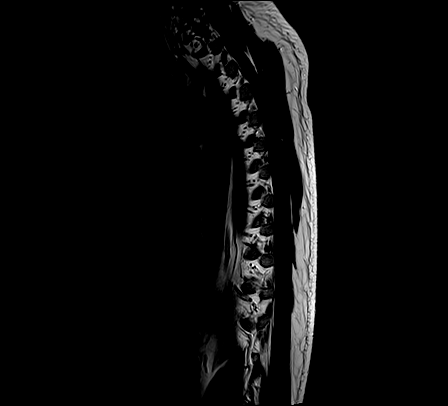

[Series 20: T1 · sagittal · 3.0mm · 0.89mm/px · 6 of 17 slices shown (3 of 3)]
[im 1/17]
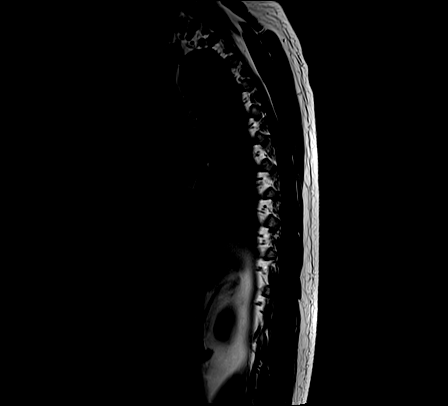
[im 4/17]
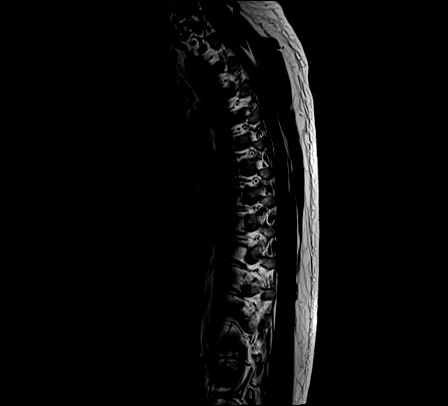
[im 7/17]
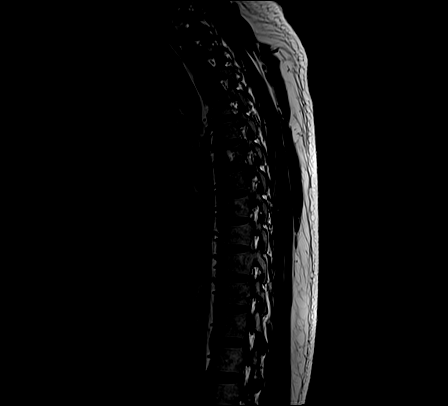
[im 10/17]
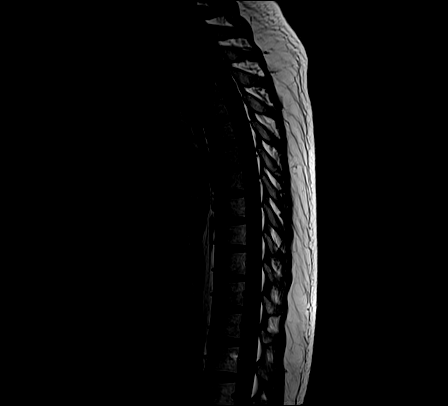
[im 13/17]
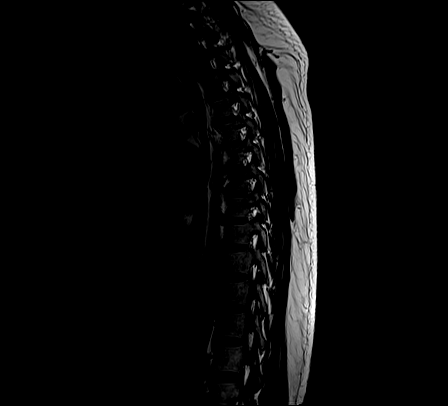
[im 17/17]
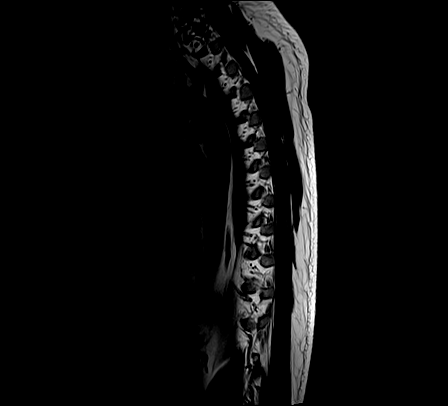

[Series 22: T2 · axial · 5.0mm · 0.74mm/px · z∈[-384,-131]mm · 9 of 39 slices shown (2 of 2)]
[im 1/39]
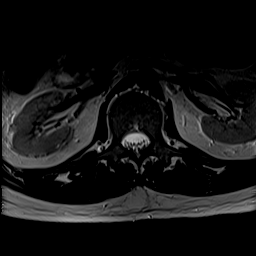
[im 7/39]
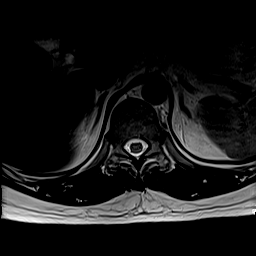
[im 13/39]
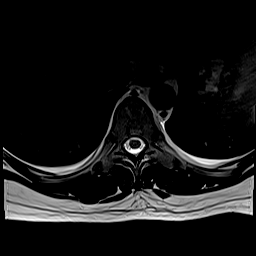
[im 16/39]
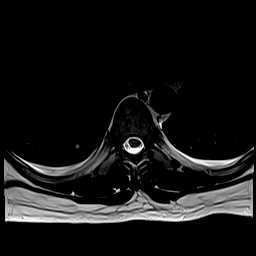
[im 20/39]
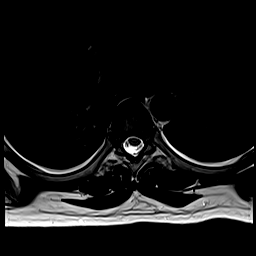
[im 23/39]
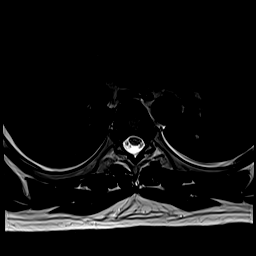
[im 26/39]
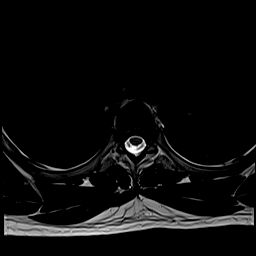
[im 32/39]
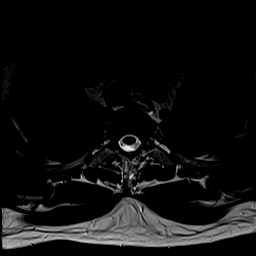
[im 39/39]
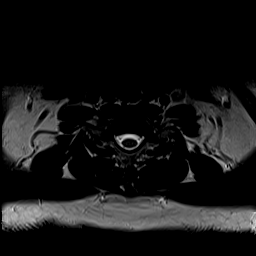

[25 of 48 positions shown; findings below may reference images not displayed]

FINDINGS: MRI CERVICAL SPINE FINDINGS

Alignment: Physiologic.

Vertebrae: No fracture, evidence of discitis, or bone lesion.

Cord: Normal signal and morphology.

Posterior Fossa, vertebral arteries, paraspinal tissues:
Unremarkable

Disc levels:

Diffusely preserved disc height. Negative facets. No neural
impingement.

MRI THORACIC SPINE FINDINGS

Alignment:  Physiologic.

Vertebrae: No fracture, evidence of discitis, or bone lesion.

Cord:  Normal signal and morphology.

Paraspinal and other soft tissues: No perispinal mass or
inflammation

Disc levels:

Diffusely preserved disc height and hydration. Negative facets. No
neural impingement

MRI LUMBAR SPINE FINDINGS

Segmentation:  5 lumbar type vertebrae

Alignment:  Straightening of the lumbar spine

Vertebrae:  No fracture, evidence of discitis, or bone lesion.

Conus medullaris and cauda equina: Conus extends to the T12-L1
level. Conus and cauda equina appear normal.

Paraspinal and other soft tissues: Negative for perispinal mass or
inflammation

Disc levels:

L4-5: Disc desiccation and narrowing with broad central protrusion.
The herniation contacts the descending L5 nerve roots without
compression.

L5-S1: Disc desiccation and narrowing with central protrusion
contacting but not compressing the S1 nerve roots.
IMPRESSION: 1. Normal MRI of the cervical and thoracic spine. No evidence of
myelopathy.
2. L4-5 and L5-S1 disc protrusions which contact but do not compress
the descending L5 and S1 nerve roots. Patent foramina.

## 2022-03-12 IMAGING — MR MR LUMBAR SPINE W/O CM
4 of 5 series · 29 of 48 positions shown · non-contrast
Comparison: None.

CLINICAL DATA: Acute lumbar myelopathy. Persisting lower back pain
radiating to the left lower leg for 1 year. Mild dysuria. Rule out
myelopathy

EXAM:
MRI CERVICAL, THORACIC AND LUMBAR SPINE WITHOUT CONTRAST
TECHNIQUE: Multiplanar and multiecho pulse sequences of the cervical spine, to
include the craniocervical junction and cervicothoracic junction,
and thoracic and lumbar spine, were obtained without intravenous
contrast.

[Series 1: T2 · sagittal · 4.0mm · 0.73mm/px · 6 of 16 slices shown (1 of 2)]
[im 1/16]
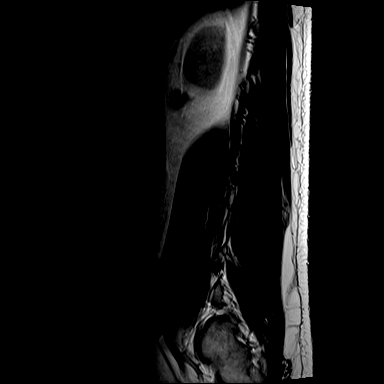
[im 4/16]
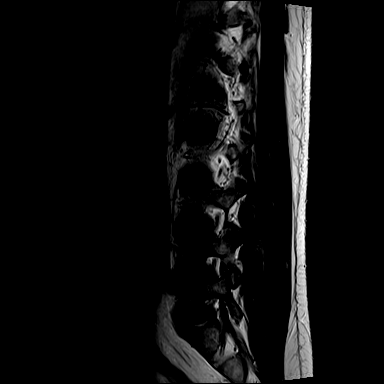
[im 7/16]
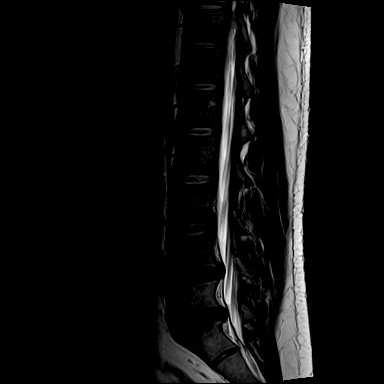
[im 10/16]
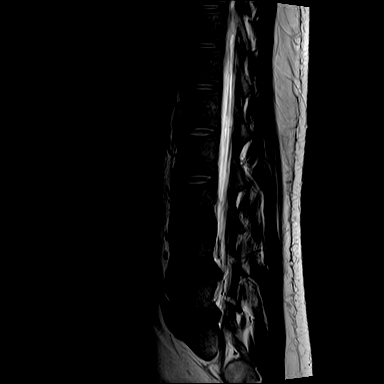
[im 13/16]
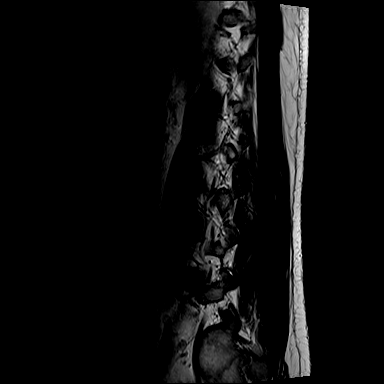
[im 16/16]
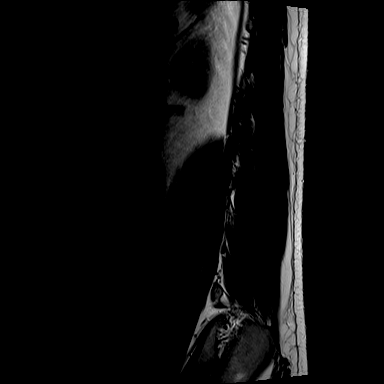

[Series 3: T1 · sagittal · 4.0mm · 0.88mm/px · 7 of 16 slices shown (1 of 2)]
[im 1/16]
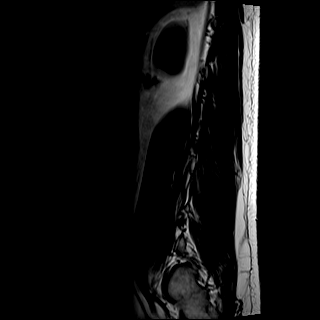
[im 3/16]
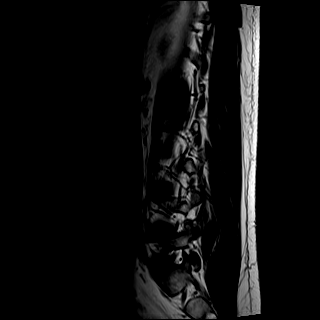
[im 6/16]
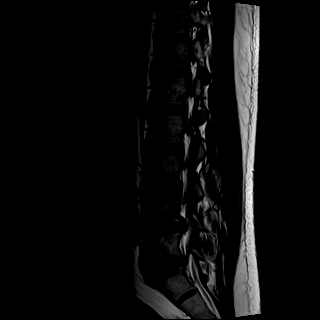
[im 8/16]
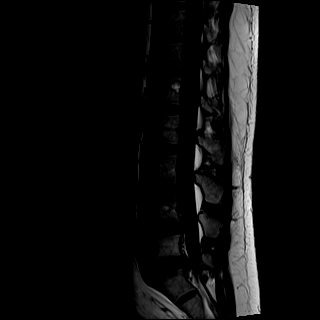
[im 11/16]
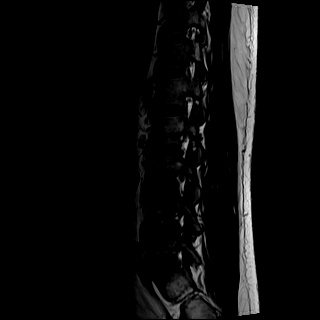
[im 13/16]
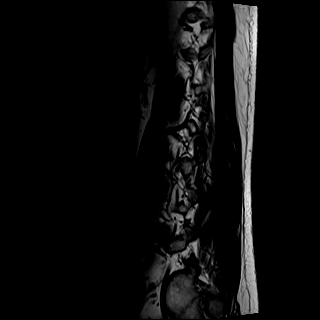
[im 16/16]
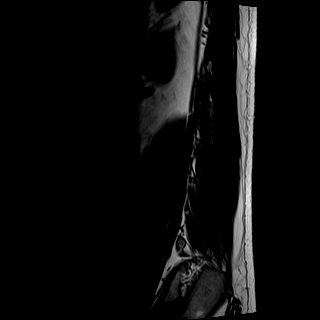

[Series 4: T2 · axial · 5.0mm · 0.57mm/px · z∈[-578,-347]mm · 8 of 31 slices shown (2 of 2)]
[im 1/31]
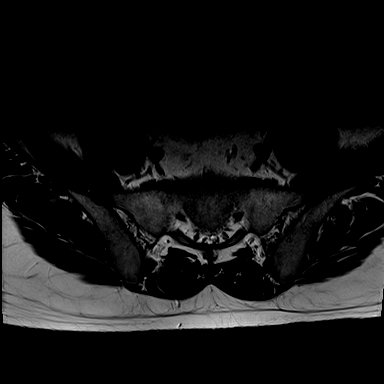
[im 5/31]
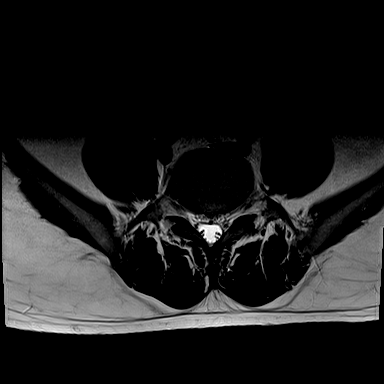
[im 10/31]
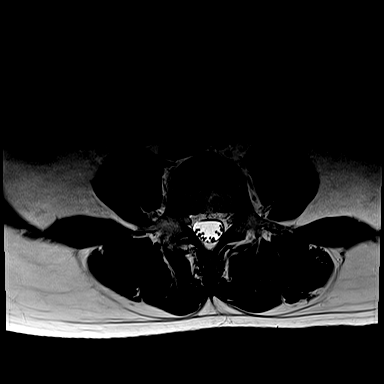
[im 14/31]
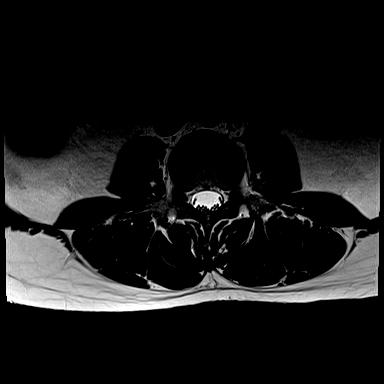
[im 17/31]
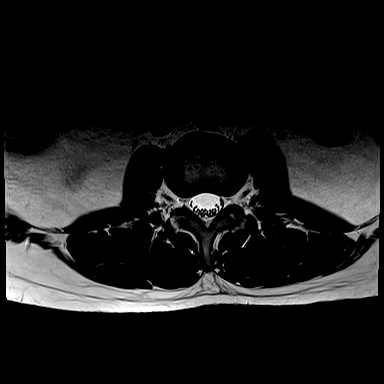
[im 21/31]
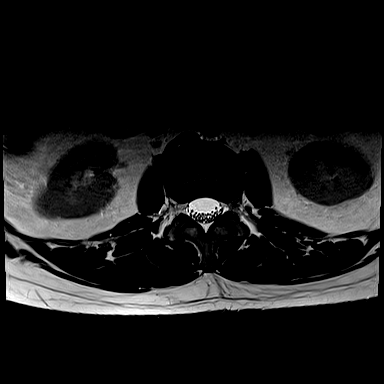
[im 26/31]
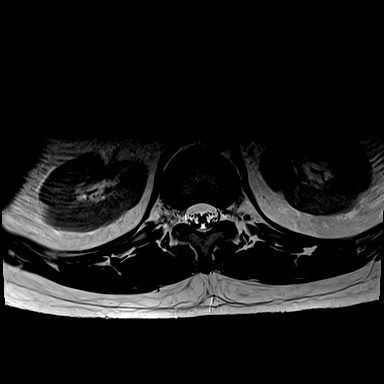
[im 31/31]
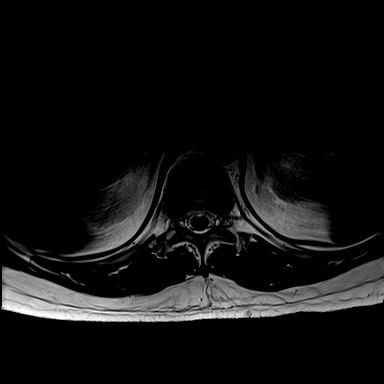

[Series 5: T1 · axial · 5.0mm · 0.43mm/px · z∈[-578,-347]mm · 8 of 31 slices shown (2 of 2)]
[im 1/31]
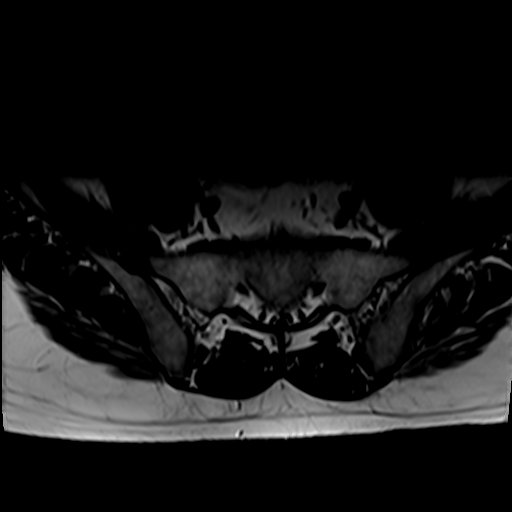
[im 5/31]
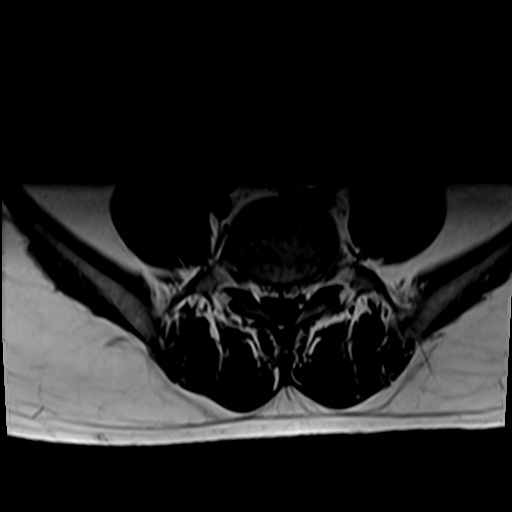
[im 10/31]
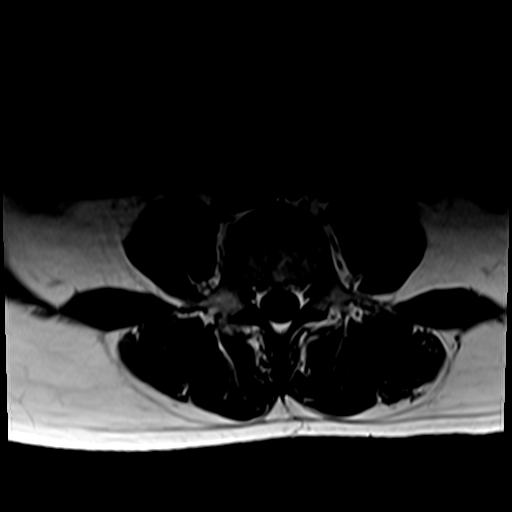
[im 14/31]
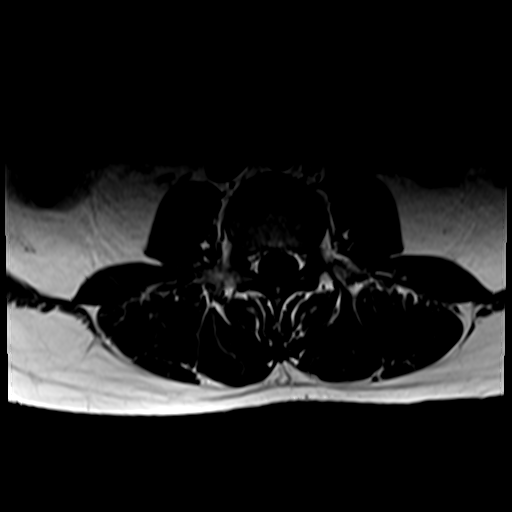
[im 17/31]
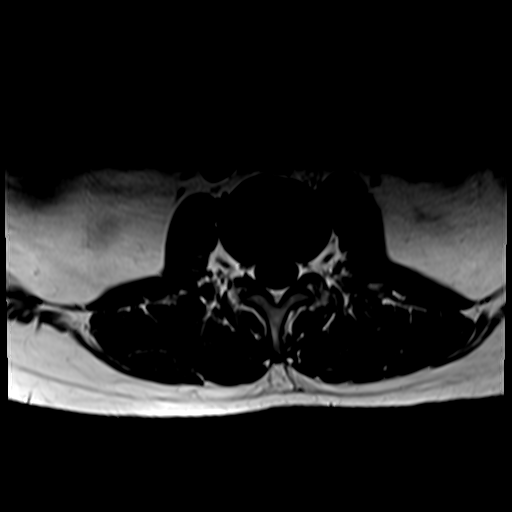
[im 21/31]
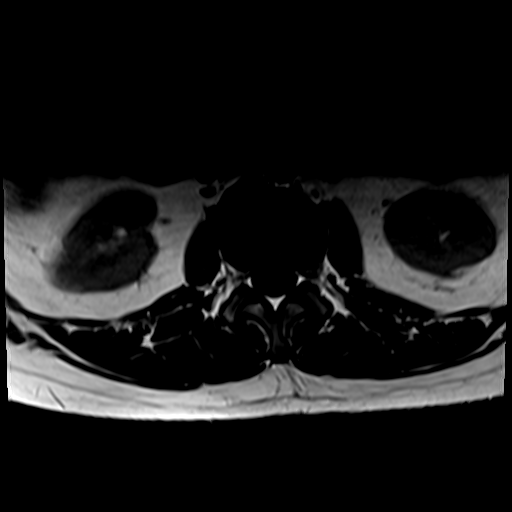
[im 26/31]
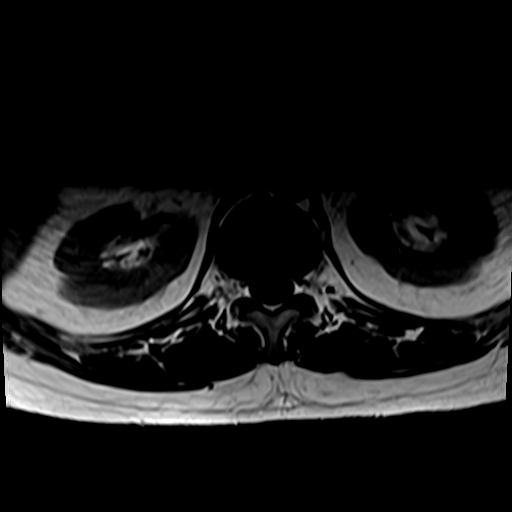
[im 31/31]
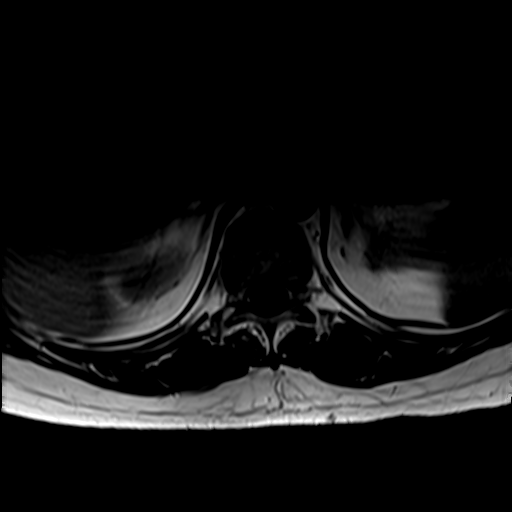

[29 of 48 positions shown; findings below may reference images not displayed]

FINDINGS: MRI CERVICAL SPINE FINDINGS

Alignment: Physiologic.

Vertebrae: No fracture, evidence of discitis, or bone lesion.

Cord: Normal signal and morphology.

Posterior Fossa, vertebral arteries, paraspinal tissues:
Unremarkable

Disc levels:

Diffusely preserved disc height. Negative facets. No neural
impingement.

MRI THORACIC SPINE FINDINGS

Alignment:  Physiologic.

Vertebrae: No fracture, evidence of discitis, or bone lesion.

Cord:  Normal signal and morphology.

Paraspinal and other soft tissues: No perispinal mass or
inflammation

Disc levels:

Diffusely preserved disc height and hydration. Negative facets. No
neural impingement

MRI LUMBAR SPINE FINDINGS

Segmentation:  5 lumbar type vertebrae

Alignment:  Straightening of the lumbar spine

Vertebrae:  No fracture, evidence of discitis, or bone lesion.

Conus medullaris and cauda equina: Conus extends to the T12-L1
level. Conus and cauda equina appear normal.

Paraspinal and other soft tissues: Negative for perispinal mass or
inflammation

Disc levels:

L4-5: Disc desiccation and narrowing with broad central protrusion.
The herniation contacts the descending L5 nerve roots without
compression.

L5-S1: Disc desiccation and narrowing with central protrusion
contacting but not compressing the S1 nerve roots.
IMPRESSION: 1. Normal MRI of the cervical and thoracic spine. No evidence of
myelopathy.
2. L4-5 and L5-S1 disc protrusions which contact but do not compress
the descending L5 and S1 nerve roots. Patent foramina.

## 2022-03-12 IMAGING — MR MR CERVICAL SPINE W/O CM
5 series · 37 of 48 positions shown · non-contrast
Comparison: None.

CLINICAL DATA: Acute lumbar myelopathy. Persisting lower back pain
radiating to the left lower leg for 1 year. Mild dysuria. Rule out
myelopathy

EXAM:
MRI CERVICAL, THORACIC AND LUMBAR SPINE WITHOUT CONTRAST
TECHNIQUE: Multiplanar and multiecho pulse sequences of the cervical spine, to
include the craniocervical junction and cervicothoracic junction,
and thoracic and lumbar spine, were obtained without intravenous
contrast.

[Series 1: T2 · sagittal · 3.0mm · 0.69mm/px · 6 of 15 slices shown (1 of 2)]
[im 1/15]
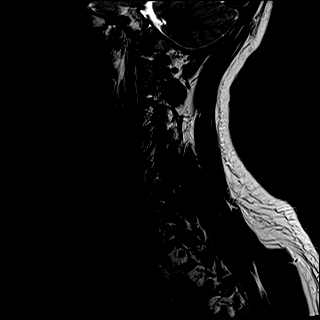
[im 3/15]
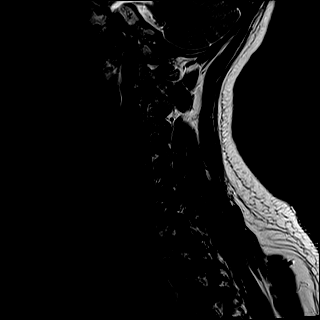
[im 6/15]
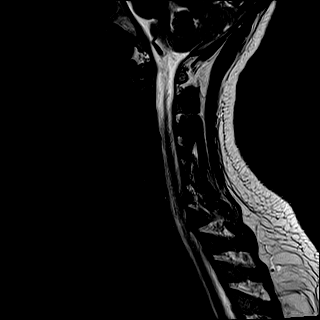
[im 9/15]
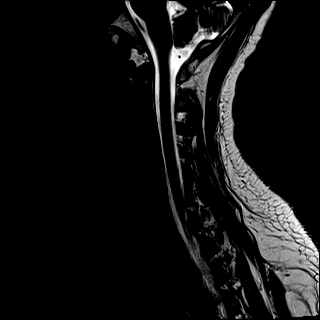
[im 12/15]
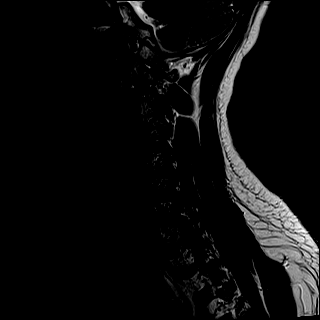
[im 15/15]
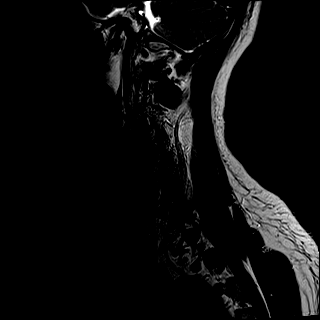

[Series 2: T1 · sagittal · 3.0mm · 0.69mm/px · 6 of 15 slices shown]
[im 1/15]
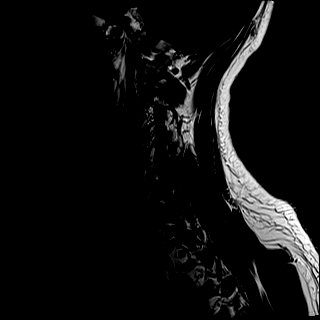
[im 3/15]
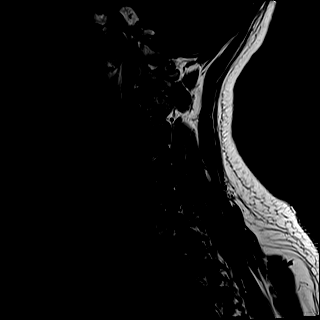
[im 6/15]
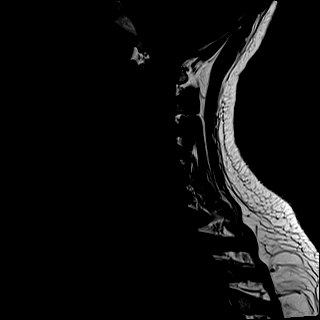
[im 9/15]
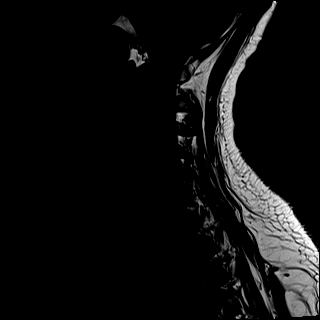
[im 12/15]
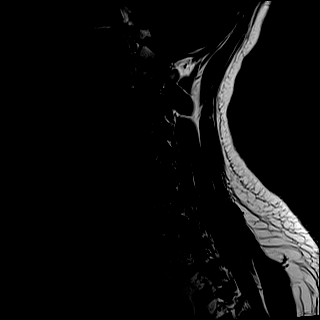
[im 15/15]
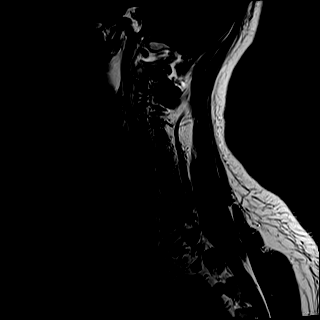

[Series 3: STIR · sagittal · 3.0mm · 0.86mm/px · 6 of 15 slices shown]
[im 1/15]
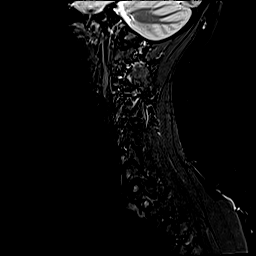
[im 3/15]
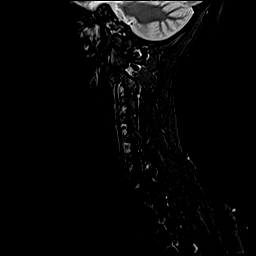
[im 6/15]
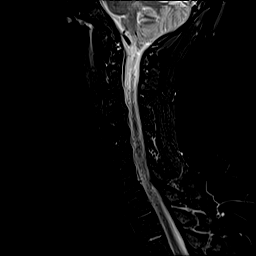
[im 9/15]
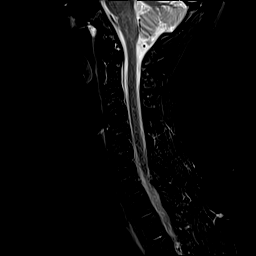
[im 12/15]
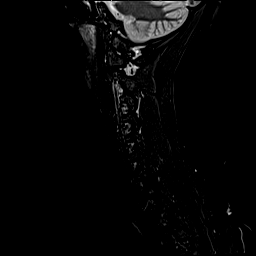
[im 15/15]
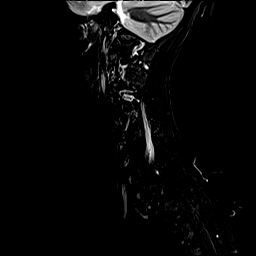

[Series 4: T2 · axial · 3.0mm · 0.66mm/px · z∈[-151,-25]mm · 11 of 40 slices shown (2 of 2)]
[im 1/40]
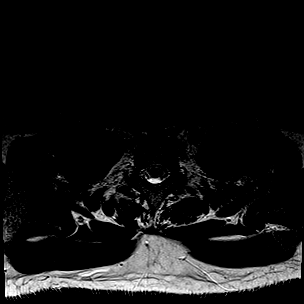
[im 3/40]
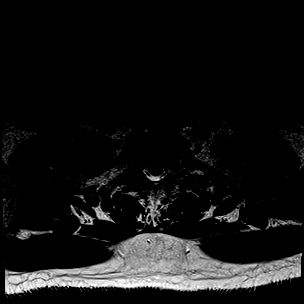
[im 6/40]
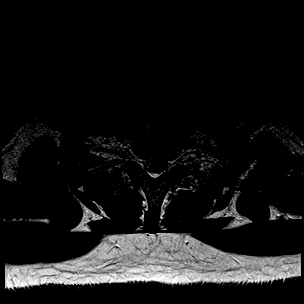
[im 9/40]
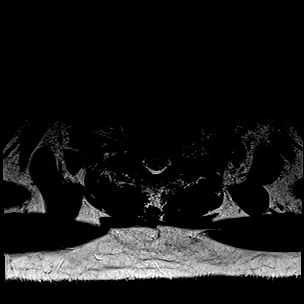
[im 12/40]
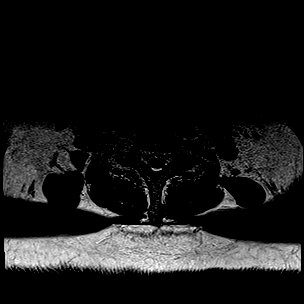
[im 17/40]
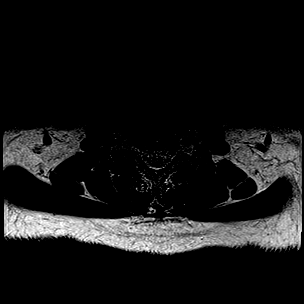
[im 20/40]
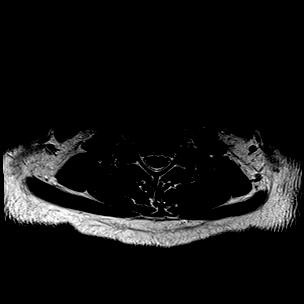
[im 23/40]
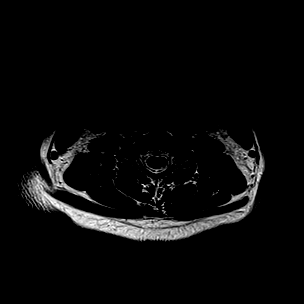
[im 28/40]
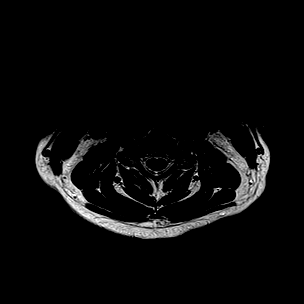
[im 34/40]
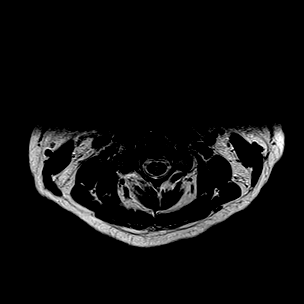
[im 40/40]
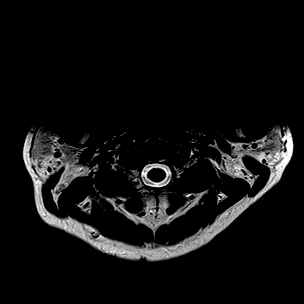

[Series 5: GRE · axial · 3.0mm · 0.35mm/px · z∈[-148,-23]mm · 8 of 40 slices shown]
[im 1/40]
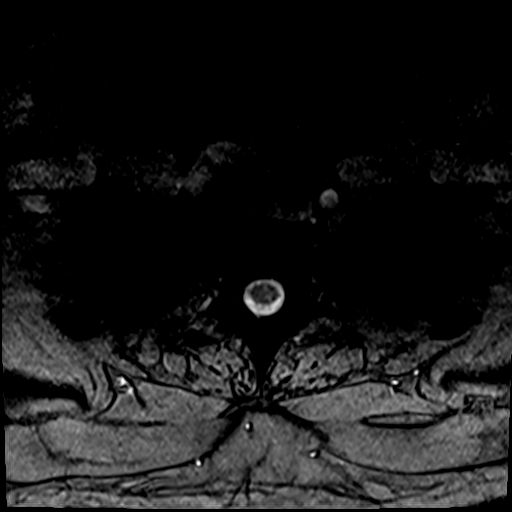
[im 6/40]
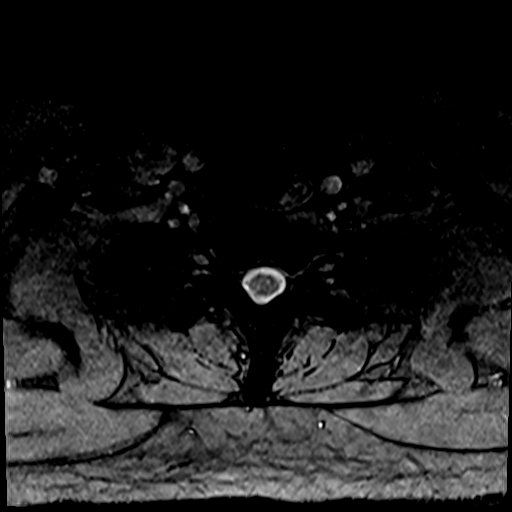
[im 12/40]
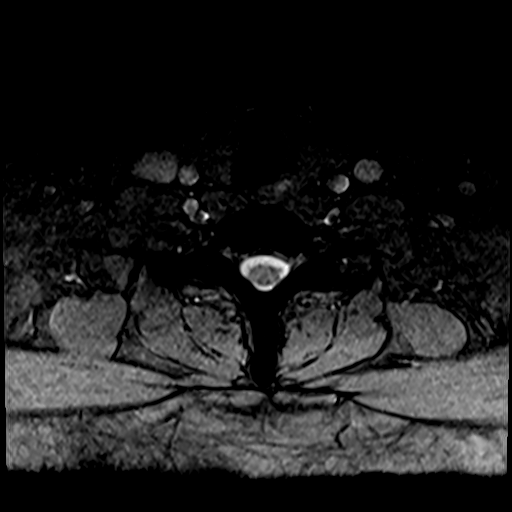
[im 17/40]
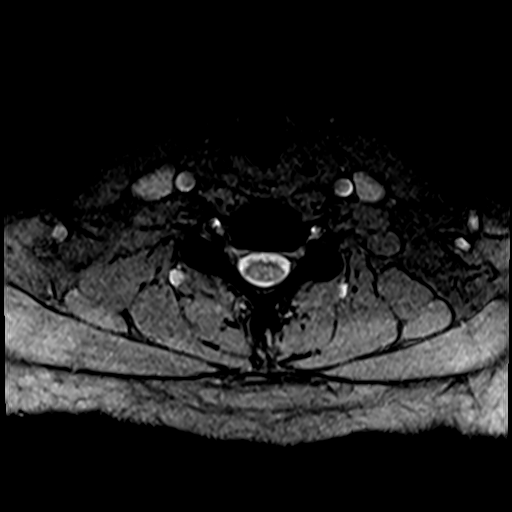
[im 23/40]
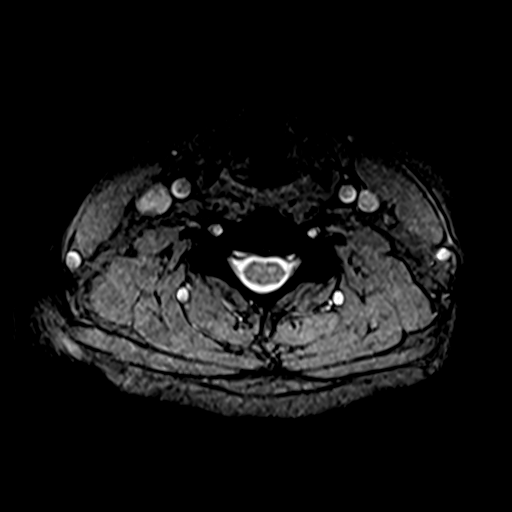
[im 28/40]
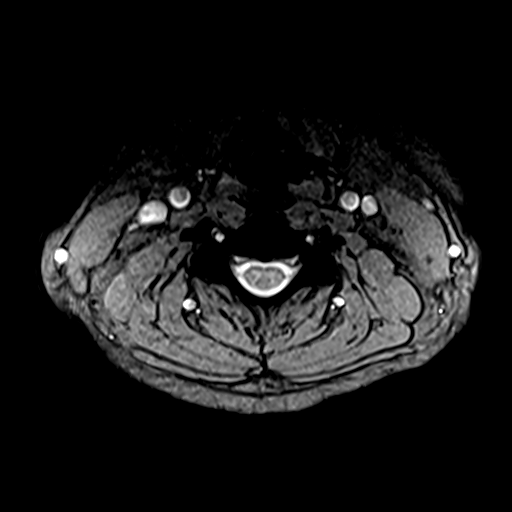
[im 34/40]
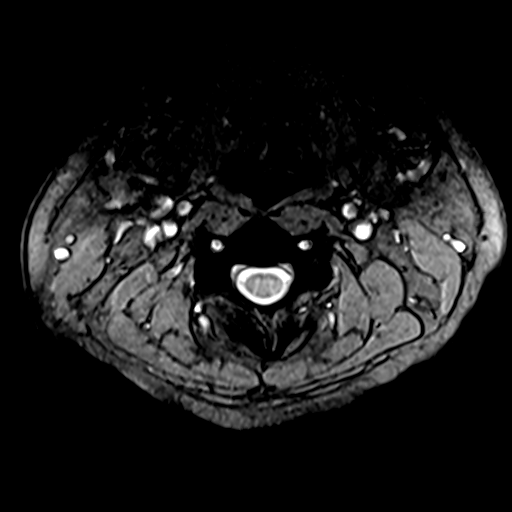
[im 40/40]
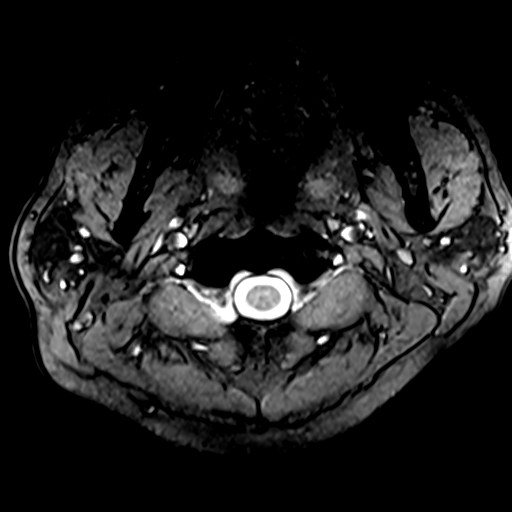

[37 of 48 positions shown; findings below may reference images not displayed]

FINDINGS: MRI CERVICAL SPINE FINDINGS

Alignment: Physiologic.

Vertebrae: No fracture, evidence of discitis, or bone lesion.

Cord: Normal signal and morphology.

Posterior Fossa, vertebral arteries, paraspinal tissues:
Unremarkable

Disc levels:

Diffusely preserved disc height. Negative facets. No neural
impingement.

MRI THORACIC SPINE FINDINGS

Alignment:  Physiologic.

Vertebrae: No fracture, evidence of discitis, or bone lesion.

Cord:  Normal signal and morphology.

Paraspinal and other soft tissues: No perispinal mass or
inflammation

Disc levels:

Diffusely preserved disc height and hydration. Negative facets. No
neural impingement

MRI LUMBAR SPINE FINDINGS

Segmentation:  5 lumbar type vertebrae

Alignment:  Straightening of the lumbar spine

Vertebrae:  No fracture, evidence of discitis, or bone lesion.

Conus medullaris and cauda equina: Conus extends to the T12-L1
level. Conus and cauda equina appear normal.

Paraspinal and other soft tissues: Negative for perispinal mass or
inflammation

Disc levels:

L4-5: Disc desiccation and narrowing with broad central protrusion.
The herniation contacts the descending L5 nerve roots without
compression.

L5-S1: Disc desiccation and narrowing with central protrusion
contacting but not compressing the S1 nerve roots.
IMPRESSION: 1. Normal MRI of the cervical and thoracic spine. No evidence of
myelopathy.
2. L4-5 and L5-S1 disc protrusions which contact but do not compress
the descending L5 and S1 nerve roots. Patent foramina.

## 2022-03-12 MED ORDER — PREDNISONE 20 MG PO TABS: 40.0000 mg | ORAL_TABLET | Freq: Every day | ORAL | 0 refills | Status: AC

## 2022-03-12 MED ORDER — HYDROMORPHONE HCL 1 MG/ML IJ SOLN
1.0000 mg | Freq: Once | INTRAMUSCULAR | Status: AC
  Administered 2022-03-12: 1 mg via INTRAVENOUS
  Filled 2022-03-12: qty 1

## 2022-03-12 MED ORDER — OXYCODONE HCL 5 MG PO TABS: 5.0000 mg | ORAL_TABLET | ORAL | 0 refills | Status: DC | PRN

## 2022-03-12 NOTE — ED Notes (Signed)
Patient verbalizes understanding of d/c instructions. Opportunities for questions and answers were provided. Pt d/c from ED and wheeled to lobby.  

## 2022-03-12 NOTE — ED Notes (Signed)
Patient transported to MRI 

## 2022-03-12 NOTE — ED Provider Notes (Signed)
?[facility]-EMERGENCY DEPT ?[facility] Emergency Department ?Provider Note ?MRN:  [id number]  ?Arrival date & time: 03/12/22    ? ?Chief Complaint   ?Back Pain ?  ?History of Present Illness   ?Nicholas Stafford is a 36 y.o. year-old male with no pertinent past medical presenting to the ED with chief complaint of back pain. ? ?Acute on chronic back pain that is now severe.  Located in the mid to the lower back.  Associated with numbness to the left leg, decreased strength to the right leg.  Also has some intermittent paresthesias to the bilateral hands.  No headache or vision change, no other complaints. ? ?Review of Systems  ?A thorough review of systems was obtained and all systems are negative except as noted in the HPI and PMH.  ? ?Patient's Health History   ? ?Past Medical History:  ?Diagnosis Date  ? External hemorrhoids   ?  ?History reviewed. No pertinent surgical history.  ?Family History  ?Family history unknown: Yes  ?  ?Social History  ? ?Socioeconomic History  ? Marital status: Married  ?  Spouse name: Not on file  ? Number of children: Not on file  ? Years of education: Not on file  ? Highest education level: Not on file  ?Occupational History  ? Not on file  ?Tobacco Use  ? Smoking status: Never  ? Smokeless tobacco: Never  ?Substance and Sexual Activity  ? Alcohol use: Never  ? Drug use: Never  ? Sexual activity: Yes  ?Other Topics Concern  ? Not on file  ?Social History Narrative  ? Not on file  ? ?Social Determinants of Health  ? ?Financial Resource Strain: Not on file  ?Food Insecurity: Not on file  ?Transportation Needs: Not on file  ?Physical Activity: Not on file  ?Stress: Not on file  ?Social Connections: Not on file  ?Intimate Partner Violence: Not on file  ?  ? ?Physical Exam  ? ?Vitals:  ? 03/12/22 0556 03/12/22 0645  ?BP: 107/71 108/70  ?Pulse: 64 60  ?Resp: 18 18  ?Temp:    ?SpO2: 98% 99%  ?  ?CONSTITUTIONAL: Well-appearing, NAD ?NEURO/PSYCH:  Alert and oriented x 3, subjective  decreased sensation to the left leg ?EYES:  eyes equal and reactive ?ENT/NECK:  no LAD, no JVD ?CARDIO: Regular rate, well-perfused, normal S1 and S2 ?PULM:  CTAB no wheezing or rhonchi ?GI/GU:  non-distended, non-tender ?MSK/SPINE:  No gross deformities, no edema ?SKIN:  no rash, atraumatic ? ? ?*Additional and/or pertinent findings included in MDM below ? ?Diagnostic and Interventional Summary  ? ? EKG Interpretation ? ?Date/Time:    ?Ventricular Rate:    ?PR Interval:    ?QRS Duration:   ?QT Interval:    ?QTC Calculation:   ?R Axis:     ?Text Interpretation:   ?  ? ?  ? ?Labs Reviewed  ?URINALYSIS, ROUTINE W REFLEX MICROSCOPIC - Abnormal; Notable for the following components:  ?    Result Value  ? Color, Urine STRAW (*)   ? Specific Gravity, Urine 1.004 (*)   ? All other components within normal limits  ?BASIC METABOLIC PANEL - Abnormal; Notable for the following components:  ? Glucose, Bld 106 (*)   ? All other components within normal limits  ?CBC  ?  ?MR Cervical Spine Wo Contrast  ?Final Result  ?  ?MR THORACIC SPINE WO CONTRAST  ?Final Result  ?  ?MR LUMBAR SPINE WO CONTRAST  ?Final Result  ?  ?DG  Lumbar Spine Complete  ?Final Result  ?  ?  ?Medications  ?HYDROmorphone (DILAUDID) injection 1 mg (1 mg Intravenous Given 03/12/22 0302)  ?  ? ?Procedures  /  Critical Care ?Procedures ? ?ED Course and Medical Decision Making  ?Initial Impression and Ddx ?Concern for myelopathy given the progressive neurological deficit and severe back pain.  Will need MRI imaging. ? ?Past medical/surgical history that increases complexity of ED encounter: None ? ?Interpretation of Diagnostics ?I personally reviewed the laboratory assessment and my interpretation is as follows: No significant blood count or electrolyte disturbance. ?   ?MRI revealing herniated disc but no myelopathy. ? ?Patient Reassessment and Ultimate Disposition/Management ?Patient feeling better, has no objective neurological deficits on my exam, appropriate  for discharge with spine specialist follow-up. ? ?Patient management required discussion with the following services or consulting groups:  None ? ?Complexity of Problems Addressed ?Acute illness or injury that poses threat of life of bodily function ? ?Additional Data Reviewed and Analyzed ?Further history obtained from: ?Further history from spouse/family member; Nepali interpreter utilized. ? ?Additional Factors Impacting ED Encounter Risk ?Use of parenteral controlled substances and Consideration of hospitalization ? ?Elmer Sow. Pilar Plate, MD ?[facility] Emergency Medicine ?[facility] [email or web address] ? ?Final Clinical Impressions(s) / ED Diagnoses  ? ?  ICD-10-CM   ?1. Acute midline low back pain without sciatica  M54.50   ?  ?2. Lumbar herniated disc  M51.26   ?  ?  ?ED Discharge Orders   ? ?      Ordered  ?  oxyCODONE (ROXICODONE) 5 MG immediate release tablet  Every 4 hours PRN       ? 03/12/22 0649  ?  predniSONE (DELTASONE) 20 MG tablet  Daily       ? 03/12/22 0649  ? ?  ?  ? ?  ?  ? ?Discharge Instructions Discussed with and Provided to Patient:  ? ? ? ?Discharge Instructions   ? ?  ?You were evaluated in the Emergency Department and after careful evaluation, we did not find any emergent condition requiring admission or further testing in the hospital. ? ?Your exam/testing today was overall reassuring.  Your MRI shows a herniated disc.  Please take the prednisone anti-inflammatory steroid medicine as directed.  Recommend Tylenol 1000 mg every 4-6 hours for pain.  You can use the oxycodone medication for more significant pain. ? ?Please return to the Emergency Department if you experience any worsening of your condition.  Thank you for allowing [place] to be a part of your care. ? ? ? ? ? ?  ?Sabas Sous, MD ?03/12/22 0700 ? ?

## 2022-03-12 NOTE — Discharge Instructions (Addendum)
You were evaluated in the Emergency Department and after careful evaluation, we did not find any emergent condition requiring admission or further testing in the hospital. ? ?Your exam/testing today was overall reassuring.  Your MRI shows a herniated disc.  Please take the prednisone anti-inflammatory steroid medicine as directed.  Recommend Tylenol 1000 mg every 4-6 hours for pain.  You can use the oxycodone medication for more significant pain. ? ?Please return to the Emergency Department if you experience any worsening of your condition.  Thank you for allowing [place] to be a part of your care. ? ?

## 2022-12-28 ENCOUNTER — Encounter (HOSPITAL_COMMUNITY): Payer: Self-pay

## 2022-12-28 ENCOUNTER — Emergency Department (HOSPITAL_COMMUNITY)
Admission: EM | Admit: 2022-12-28 | Discharge: 2022-12-29 | Disposition: A | Discharge status: Home / Self Care | Payer: BLUE CROSS/BLUE SHIELD | Attending: Emergency Medicine | Admitting: Emergency Medicine

## 2022-12-28 ENCOUNTER — Other Ambulatory Visit: Payer: Self-pay

## 2022-12-28 DIAGNOSIS — M5432 Sciatica, left side: Secondary | ICD-10-CM

## 2022-12-28 DIAGNOSIS — M5442 Lumbago with sciatica, left side: Secondary | ICD-10-CM | POA: Diagnosis not present

## 2022-12-28 DIAGNOSIS — M545 Low back pain, unspecified: Secondary | ICD-10-CM | POA: Diagnosis present

## 2022-12-28 MED ORDER — KETOROLAC TROMETHAMINE 15 MG/ML IJ SOLN
15.0000 mg | Freq: Once | INTRAMUSCULAR | Status: AC
Start: 2022-12-28 — End: 2022-12-28
  Administered 2022-12-28: 15 mg via INTRAMUSCULAR
  Filled 2022-12-28: qty 1

## 2022-12-28 MED ORDER — GABAPENTIN 100 MG PO CAPS: 300.0000 mg | ORAL_CAPSULE | Freq: Three times a day (TID) | ORAL | 0 refills | Status: AC

## 2022-12-28 MED ORDER — LIDOCAINE 5 % EX PTCH
1.0000 | MEDICATED_PATCH | Freq: Once | CUTANEOUS | Status: DC
  Administered 2022-12-28: 1 via TRANSDERMAL
  Filled 2022-12-28: qty 1

## 2022-12-28 MED ORDER — METHOCARBAMOL 500 MG PO TABS: 500.0000 mg | ORAL_TABLET | Freq: Two times a day (BID) | ORAL | 0 refills | Status: DC

## 2022-12-28 MED ORDER — ACETAMINOPHEN 500 MG PO TABS
1000.0000 mg | ORAL_TABLET | Freq: Once | ORAL | Status: AC
Start: 2022-12-28 — End: 2022-12-28
  Administered 2022-12-28: 1000 mg via ORAL
  Filled 2022-12-28: qty 2

## 2022-12-28 MED ORDER — GABAPENTIN 300 MG PO CAPS
300.0000 mg | ORAL_CAPSULE | Freq: Three times a day (TID) | ORAL | Status: DC
  Administered 2022-12-28: 300 mg via ORAL
  Filled 2022-12-28: qty 1

## 2022-12-28 NOTE — ED Triage Notes (Signed)
Pt came in via POV d/t back pain that started yesterday & his Lt leg pain that started 2 weeks ago. Denies recent falls & injuries, A/Ox4, rates pain 9/10.

## 2022-12-28 NOTE — ED Provider Notes (Signed)
[facility] Provider Note   CSN: [id number] Arrival date & time: 12/28/22  1823     History  Chief Complaint  Patient presents with   Back Pain   Lt Leg Pain    Nicholas Stafford is a 37 y.o. male with HLD, hemorrhoids, who presents with back pain radiating down left leg, rated 9/10.   History obtained utilizing a Nepali audio interpreter.   Patient presents with atraumatic lower back pain radiating down his posterior left buttock and leg to the back of his knee that began 2 weeks ago and worsened yesterday.  He states that he sneezed really hard yesterday which triggered the back pain and made it more severe.  He has never had any falls or trauma or fractures in his back.  He states he had a similar pain in the past and he got better with stretching exercise and chiropractor visits.  He denies any fever/chills, history malignancy, history of IVDU, bowel or bladder incontinence or retention, weakness of his lower extremities.  Per chart review, [facility] and diagnosed w/ sciatica, given a steroid and gabapentin. Finished the steroid and is still taking gabapentin but doesn't think either of them helped at all.  Patient has been trying to do the stretches and exercises at home that he did before.   Back Pain      Home Medications Prior to Admission medications   Medication Sig Start Date End Date Taking? Authorizing Provider  methocarbamol (ROBAXIN) 500 MG tablet Take 1 tablet (500 mg total) by mouth 2 (two) times daily. 12/28/22  Yes Audley Hose, MD  cyclobenzaprine (FLEXERIL) 5 MG tablet Take 1 tablet (5 mg total) by mouth 3 (three) times daily as needed for muscle spasms. 10/23/20   Ward, Lenise Arena, PA-C  gabapentin (NEURONTIN) 100 MG capsule Take 3 capsules (300 mg total) by mouth 3 (three) times daily. 12/28/22   Audley Hose, MD  meloxicam (MOBIC) 7.5 MG tablet Take 1 tablet (7.5 mg total) by  mouth daily. 10/23/20   Ward, Lenise Arena, PA-C  oxyCODONE (ROXICODONE) 5 MG immediate release tablet Take 1 tablet (5 mg total) by mouth every 4 (four) hours as needed for severe pain. 03/12/22   Maudie Flakes, MD  pantoprazole (PROTONIX) 20 MG tablet Take 1 tablet (20 mg total) by mouth 2 (two) times daily. 12/07/18   Ward, Ozella Almond, PA-C      Allergies    Patient has no known allergies.    Review of Systems   Review of Systems  Musculoskeletal:  Positive for back pain.   Review of systems Negative for f/c.  A 10 point review of systems was performed and is negative unless otherwise reported in HPI.  Physical Exam Updated Vital Signs BP 121/82   Pulse 83   Temp 98.1 F (36.7 C) (Oral)   Resp 18   Ht 5\' 9"  (1.753 m)   Wt 74.8 kg   SpO2 99%   BMI 24.37 kg/m  Physical Exam General: Normal appearing male, lying in bed.  HEENT: Sclera anicteric, MMM, trachea midline.  Cardiology: RRR, no murmurs/rubs/gallops. Resp: Normal respiratory rate and effort. CTAB, no wheezes, rhonchi, crackles.  Abd: Soft, non-tender, non-distended. No rebound tenderness or guarding.  GU: Deferred. MSK: Left lower extremity compartments soft nontender.  No deformities or signs of injury.  No swelling erythema.  Distal pulses intact.  No peripheral edema or signs of  trauma. Extremities without deformity or TTP. No cyanosis or clubbing. Skin: warm, dry. No rashes or lesions. Back: Midline tenderness to palpation of diffuse lumbar spine. Bilateral paraspinal muscle tenderness. No CVA tenderness. + straight leg raise on L.   Neuro: A&Ox4, CNs II-XII grossly intact. MAEs. Sensation grossly intact.  Psych: Normal mood and affect.   ED Results / Procedures / Treatments   Labs (all labs ordered are listed, but only abnormal results are displayed) Labs Reviewed - No data to display  EKG None  Radiology No results found.  Procedures Procedures    Medications Ordered in ED Medications  lidocaine  (LIDODERM) 5 % 1 patch (1 patch Transdermal Patch Applied 12/28/22 2351)  gabapentin (NEURONTIN) capsule 300 mg (300 mg Oral Given 12/28/22 2352)  acetaminophen (TYLENOL) tablet 1,000 mg (1,000 mg Oral Given 12/28/22 2352)  ketorolac (TORADOL) 15 MG/ML injection 15 mg (15 mg Intramuscular Given 12/28/22 2351)    ED Course/ Medical Decision Making/ A&P                          Medical Decision Making Risk OTC drugs. Prescription drug management.    This patient presents to the ED for concern of back pain radiating down left leg, this involves an extensive number of treatment options, and is a complaint that carries with it a high risk of complications and morbidity.   MDM:    DDX for low back pain includes but is not limited to:   Patient's atraumatic lower back pain radiating down the posterior buttock and leg is consistent with left-sided sciatica.  Straight leg test was positive.  Presentation not consistent with malignancy (lack of history of malignancy, lack of B symptoms), fracture (no trauma), cauda equina (no bowel or urinary incontinence/retention, no saddle anesthesia, no distal weakness), AAA, viscus perforation, osteomyelitis or epidural abscess (no IVDU, no fevers or chills), renal colic, pyelonephritis (afebrile, no CVAT, no urinary symptoms). Given the clinical picture, no indication for imaging at this time.  Patient is given Tylenol Toradol Robaxin and gabapentin as well as a lidocaine patch.  He is already completed prednisone which he states did not help.  I have advised that he increase gabapentin as he was taking 300 mg twice daily can increase to 300 milligrams 3 times daily and have prescribed Robaxin 500 mg twice daily, as well as heat ice Tylenol and Motrin.  I suggested that patient continue with stretching and exercises and continue to follow-up.  I recommended physical therapy which can be helpful to patients with sciatica.  Patient wanted to discuss the possibility of  surgery and I will give patient information to follow-up with a spine doctor, though I informed him that 80% of patients improve without surgery.  Patient is given extensive discharge instructions and return precautions, all questions answered to patient satisfaction.      Additional history obtained from chart review. External records reviewed family practice notes.    Reevaluation: After the interventions noted above, I reevaluated the patient and found that they have :improved  Social Determinants of Health: Patient lives independently   Disposition:  DC  Co morbidities that complicate the patient evaluation  Past Medical History:  Diagnosis Date   External hemorrhoids      Medicines Meds ordered this encounter  Medications   acetaminophen (TYLENOL) tablet 1,000 mg   ketorolac (TORADOL) 15 MG/ML injection 15 mg   lidocaine (LIDODERM) 5 % 1 patch   gabapentin (NEURONTIN) capsule  300 mg   gabapentin (NEURONTIN) 100 MG capsule    Sig: Take 3 capsules (300 mg total) by mouth 3 (three) times daily.    Dispense:  90 capsule    Refill:  0   methocarbamol (ROBAXIN) 500 MG tablet    Sig: Take 1 tablet (500 mg total) by mouth 2 (two) times daily.    Dispense:  20 tablet    Refill:  0    I have reviewed the patients home medicines and have made adjustments as needed  Problem List / ED Course: Problem List Items Addressed This Visit   None Visit Diagnoses     Sciatica of left side    -  Primary   Relevant Medications   gabapentin (NEURONTIN) capsule 300 mg   gabapentin (NEURONTIN) 100 MG capsule   methocarbamol (ROBAXIN) 500 MG tablet                   This note was created using dictation software, which may contain spelling or grammatical errors.    Audley Hose, MD 12/28/22 [phone]

## 2022-12-28 NOTE — Discharge Instructions (Addendum)
??????? ????? ?????? ?????? ?????? ??????, ????????, ?????? ???? ?? ???? ???? ??? ???????? ?????? ????? ???????? ???? ????? ? ???????? ???????? ????? ?????? ???????? ????? ?????? ? ??????? ??????? ??????????? ????????? ????? ???? ???????? ?????, ????? ??????, ? ???????? ??????? ????? ??????? (?????) ?????? ??????   Sciatica ????? ???????? ?????? ?? ?? ????? ?? ??? sciatic ?????? ?? ????? ?? ???? ??????  ?? ???? ????? ??????? ???? ????????, ?? ?????? ????? ???? ?????? ????? ?????? ??????, ??????? ?????? ??? ????:  ??????? ??????? ?????? ?? ????????? ??????  ????? ?????? ? ????????? ?????? ????????  ???????? ????????? ????? ????? ? ???? ???????  ????? ????? ???????: o ????? ? ??????? ?? ?????????? o ????? ???????? ???? ??????????  ???????? ???? ?????? ????? ? ???? (????????) ??? ?? ???? ????? ????? ????????? ?????????  ???????????  ??????? ????????  ?????? ??? ???? ??????????? ??????? ????? ??????? ????? ???????? ???? ??????  ??????, ???????? ?????? ??????????? ??? ??? ???????? ??????????  ???? ??? ?? ???? ??? ???? ??????? ????? ?????????? ?? ????? ??? ??????? ?????? ?????? ???? ???????? ????? ????? ?? ????? ????? ?????????? ??????? ????? ?? ???? ????? ???? ????? ????????? ???? ?????????? ??????? ????????? ???:  ??????? ????? ?????? ????????? ???????  ??????? ????? ????? ?????? ?? ?? ???? ????? ??????  ??????? ????? ? ?????????? ???? ??????? ??????  ???????? ??????? ??? ????? ?? ???????? ????? ??????? ????????? ???:  ??????? ????? ????? ?? ?? ????? ????? ????????? ???? ?????????? (?????)?  ????? ??? ?: o ??????? ????? ???, ??????, ?????? ?? ???????? ?????? ??? ?? ???? ?????? o ??????? ??? ???? ???? ?? ????????? o ????? ????? ????? ???????? ??????  Sciatica ?????, ???????, ??????, ?? sciatic ???????? ?????? ??????? ??, ???? ??????? ????? ???, ??????, ????? ? ????????? ?????? ??? ???????  ?? ?????? ???????? ???????? ???? ?? ???????? ??????? ?????? ??????  ??????? ?????: ????, ???????,  ????, ? ??? ?? ???? ?????? ?????? ??????   Thank you for coming to [facility] Emergency Department. You were seen for back pain. We did an exam, and that you have sciatica on the left side.   We have prescribed Robaxin 500 mg twice per day, which is a muscle relaxer which may help your pain. You can also increase your gabapentin from 300 mg twice per day to three times per day. You can alternate taking Tylenol and ibuprofen as needed for pain. You can take 650mg  tylenol (acetaminophen) every 4-6 hours, and 600 mg ibuprofen 3 times a day. You can also utilize over the counter lidocaine patches, ice/heat, massage, stretching. Some exercises are attached which you can do. Physical therapy can also help. Please follow up with your primary care provider within 1 week.   Return to the ED if you develop any of the following: - Fever (100.4 F or 38 C) or chills at home that do not respond to over the counter medications - Weakness, numbness, or tingling in your extremities - Difficulty emptying bladder / urinary incontinence - Fecal incontinence - Uncontrolled nausea/vomiting with inability to keep down liquids - Feeling as though you are going to pass out or passing out - Anything else that concerns you

## 2022-12-28 NOTE — ED Provider Triage Note (Signed)
Emergency Medicine Provider Triage Evaluation Note  Nicholas Stafford , a 37 y.o. male  was evaluated in triage.  Pt complains of back pain radiating down the left lower leg for the past couple weeks, has had history of the same in the past.  Denies any trauma.  Review of Systems  Positive: Low back pain, left leg pain Negative: Fever, urinary incontinence  Physical Exam  BP (!) 119/96 (BP Location: Right Arm)   Pulse 92   Temp 97.7 F (36.5 C)   Resp 18   SpO2 97%  Gen:   Awake, no distress   Resp:  Normal effort  MSK:   Moves extremities without difficulty  Other:    Medical Decision Making  Medically screening exam initiated at 6:51 PM.  Appropriate orders placed.  [facility] was informed that the remainder of the evaluation will be completed by another provider, this initial triage assessment does not replace that evaluation, and the importance of remaining in the ED until their evaluation is complete.     Gwenevere Abbot, [place] 12/28/22 [phone]

## 2022-12-29 MED ORDER — OXYCODONE HCL 5 MG PO TABS
5.0000 mg | ORAL_TABLET | Freq: Once | ORAL | Status: AC
  Administered 2022-12-29: 5 mg via ORAL
  Filled 2022-12-29: qty 1

## 2023-02-02 ENCOUNTER — Encounter (HOSPITAL_COMMUNITY): Payer: Self-pay | Admitting: Emergency Medicine

## 2023-02-02 ENCOUNTER — Other Ambulatory Visit: Payer: Self-pay

## 2023-02-02 ENCOUNTER — Emergency Department (HOSPITAL_COMMUNITY)
Admission: EM | Admit: 2023-02-02 | Discharge: 2023-02-02 | Disposition: A | Discharge status: Home / Self Care | Payer: BLUE CROSS/BLUE SHIELD | Attending: Emergency Medicine | Admitting: Emergency Medicine

## 2023-02-02 DIAGNOSIS — M545 Low back pain, unspecified: Secondary | ICD-10-CM | POA: Diagnosis present

## 2023-02-02 DIAGNOSIS — M5442 Lumbago with sciatica, left side: Secondary | ICD-10-CM

## 2023-02-02 MED ORDER — PREDNISONE 20 MG PO TABS: 40.0000 mg | ORAL_TABLET | Freq: Every day | ORAL | 0 refills | Status: DC

## 2023-02-02 MED ORDER — HYDROCODONE-ACETAMINOPHEN 5-325 MG PO TABS: 1.0000 | ORAL_TABLET | Freq: Four times a day (QID) | ORAL | 0 refills | Status: AC | PRN

## 2023-02-02 MED ORDER — HYDROCODONE-ACETAMINOPHEN 5-325 MG PO TABS
1.0000 | ORAL_TABLET | Freq: Once | ORAL | Status: AC
  Administered 2023-02-02: 1 via ORAL
  Filled 2023-02-02: qty 1

## 2023-02-02 MED ORDER — KETOROLAC TROMETHAMINE 30 MG/ML IJ SOLN
30.0000 mg | Freq: Once | INTRAMUSCULAR | Status: AC
  Administered 2023-02-02: 30 mg via INTRAMUSCULAR
  Filled 2023-02-02: qty 1

## 2023-02-02 MED ORDER — METHYL SALICYLATE-LIDO-MENTHOL 4-4-5 % EX PTCH: 1.0000 | MEDICATED_PATCH | Freq: Two times a day (BID) | CUTANEOUS | 0 refills | Status: DC

## 2023-02-02 MED ORDER — DICLOFENAC EPOLAMINE 1.3 % EX PTCH
1.0000 | MEDICATED_PATCH | Freq: Two times a day (BID) | CUTANEOUS | Status: DC
  Administered 2023-02-02: 1 via TRANSDERMAL
  Filled 2023-02-02: qty 1

## 2023-02-02 MED ORDER — DEXAMETHASONE SODIUM PHOSPHATE 10 MG/ML IJ SOLN
10.0000 mg | Freq: Once | INTRAMUSCULAR | Status: AC
  Administered 2023-02-02: 10 mg via INTRAMUSCULAR
  Filled 2023-02-02: qty 1

## 2023-02-02 MED ORDER — HYDROCODONE-ACETAMINOPHEN 5-325 MG PO TABS: 1.0000 | ORAL_TABLET | Freq: Four times a day (QID) | ORAL | 0 refills | Status: DC | PRN

## 2023-02-02 MED ORDER — METHYL SALICYLATE-LIDO-MENTHOL 4-4-5 % EX PTCH: 1.0000 | MEDICATED_PATCH | Freq: Two times a day (BID) | CUTANEOUS | 0 refills | Status: AC

## 2023-02-02 MED ORDER — PREDNISONE 20 MG PO TABS: 40.0000 mg | ORAL_TABLET | Freq: Every day | ORAL | 0 refills | Status: AC

## 2023-02-02 NOTE — Discharge Instructions (Signed)
Please use the provided medication as directed and follow-up with your orthopedist.  Attached is a printout from the MRI results of last year.  You should be able to obtain the disc with those results from radiology in the hospital.

## 2023-02-02 NOTE — ED Provider Triage Note (Signed)
Emergency Medicine Provider Triage Evaluation Note  Nicholas Stafford , a 37 y.o. male  was evaluated in triage.  Pt complains of low back pain with radiation to the left posterior leg.  This began last night after he stood up.  States he has had similar issues in the past and had an MRI done.  He is requesting MRI results today.  Denies bowel or bladder dysfunction, numbness, weakness, tingling.  Denies trauma  Review of Systems  Positive: As above Negative: As above  Physical Exam  BP 108/76 (BP Location: Right Arm)   Pulse 77   Temp 99 F (37.2 C) (Oral)   Resp 18   SpO2 98%  Gen:   Awake, no distress   Resp:  Normal effort  MSK:   Moves extremities without difficulty  Other:  No midline L-spine tenderness to palpation  Medical Decision Making  Medically screening exam initiated at 6:54 PM.  Appropriate orders placed.  [facility]. L. Garcia was informed that the remainder of the evaluation will be completed by another provider, this initial triage assessment does not replace that evaluation, and the importance of remaining in the ED until their evaluation is complete.     [name], [place] 02/02/23 1856

## 2023-02-02 NOTE — ED Provider Notes (Signed)
[facility] Provider Note   CSN: [id number] Arrival date & time: 02/02/23  1812     History  Chief Complaint  Patient presents with   Back Pain    Nicholas Stafford is a 37 y.o. male.  HPI Patient presents with his wife who assists with the history.  He has history of intermittent low back pain, currently presents with an acute exacerbation, bilateral low back pain radiating down both legs, left greater than right without incontinence, abdominal pain, fever, chills.  No relief with OTC medication.  Patient has had 1 MRI about a year ago, and attempted to follow-up with orthopedics, unsuccessfully.     Home Medications Prior to Admission medications   Medication Sig Start Date End Date Taking? Authorizing Provider  HYDROcodone-acetaminophen (NORCO/VICODIN) 5-325 MG tablet Take 1 tablet by mouth every 6 (six) hours as needed for severe pain. 02/02/23  Yes Carmin Muskrat, MD  Methyl Salicylate-Lido-Menthol 4-4-5 % PTCH Apply 1 patch topically in the morning and at bedtime. 02/02/23  Yes Carmin Muskrat, MD  predniSONE (DELTASONE) 20 MG tablet Take 2 tablets (40 mg total) by mouth daily with breakfast. For the next four days 02/02/23  Yes Carmin Muskrat, MD  gabapentin (NEURONTIN) 100 MG capsule Take 3 capsules (300 mg total) by mouth 3 (three) times daily. 12/28/22   Audley Hose, MD  pantoprazole (PROTONIX) 20 MG tablet Take 1 tablet (20 mg total) by mouth 2 (two) times daily. 12/07/18   Ward, Ozella Almond, PA-C      Allergies    Patient has no known allergies.    Review of Systems   Review of Systems  All other systems reviewed and are negative.   Physical Exam Updated Vital Signs BP 108/76 (BP Location: Right Arm)   Pulse 77   Temp 99 F (37.2 C) (Oral)   Resp 18   Ht 5\' 9"  (1.753 m)   Wt 74.8 kg   SpO2 98%   BMI 24.37 kg/m  Physical Exam Vitals and nursing note reviewed.  Constitutional:      General: He is  not in acute distress.    Appearance: He is well-developed.  HENT:     Head: Normocephalic and atraumatic.  Eyes:     Conjunctiva/sclera: Conjunctivae normal.  Cardiovascular:     Rate and Rhythm: Normal rate and regular rhythm.  Pulmonary:     Effort: Pulmonary effort is normal. No respiratory distress.     Breath sounds: No stridor.  Abdominal:     General: There is no distension.  Musculoskeletal:     Comments: Patient can move all extremities spontaneously including flexing both hips, and both knees, but there is positive straight leg test with the left greater than right pain in the low back.  Skin:    General: Skin is warm and dry.  Neurological:     Mental Status: He is alert and oriented to person, place, and time.     Comments: No deficiencies, no atrophy, no tremor.     ED Results / Procedures / Treatments   Labs (all labs ordered are listed, but only abnormal results are displayed) Labs Reviewed - No data to display  EKG None  Radiology No results found.  Procedures Procedures    Medications Ordered in ED Medications  diclofenac (FLECTOR) 1.3 % 1 patch (1 patch Transdermal Patch Applied 02/02/23 2049)  ketorolac (TORADOL) 30 MG/ML injection 30 mg (30 mg Intramuscular Given 02/02/23 1945)  dexamethasone (  DECADRON) injection 10 mg (10 mg Intramuscular Given 02/02/23 1945)  HYDROcodone-acetaminophen (NORCO/VICODIN) 5-325 MG per tablet 1 tablet (1 tablet Oral Given 02/02/23 1944)    ED Course/ Medical Decision Making/ A&P                             Medical Decision Making Patient with history of low back pain presents with acute exacerbation, no red flags suggesting CNS dysfunction/cauda equina.  No fever, chills, loss of sensation suggesting vascular or infectious etiology. Patient received Decadron, Toradol, topical anti-inflammatories, analgesics, was ambulatory.  Facilitation with outpatient Ortho follow up executed with burnout of recent MRI results,  instructions on how to obtain the disc.  Amount and/or Complexity of Data Reviewed Independent Historian: spouse Radiology:     Details: I read the patient's MRI from last year, disc protrusion.  Risk Prescription drug management. Decision regarding hospitalization.   8:53 PM Patient awake, alert, ambulatory.        Final Clinical Impression(s) / ED Diagnoses Final diagnoses:  Acute bilateral low back pain with left-sided sciatica    Rx / DC Orders ED Discharge Orders          Ordered    predniSONE (DELTASONE) 20 MG tablet  Daily with breakfast        02/02/23 [id number]    Methyl Salicylate-Lido-Menthol 4-4-5 % PTCH  2 times daily        02/02/23 2053    HYDROcodone-acetaminophen (NORCO/VICODIN) 5-325 MG tablet  Every 6 hours PRN        02/02/23 2053              Carmin Muskrat, MD 02/02/23 2053

## 2023-02-02 NOTE — ED Triage Notes (Signed)
Pt c/o back pain and left leg pain since yesterday. Denies recent injuries. Denies urinary symptoms.

## 2023-02-04 ENCOUNTER — Other Ambulatory Visit: Payer: Self-pay

## 2023-02-04 ENCOUNTER — Emergency Department (HOSPITAL_COMMUNITY)
Admission: EM | Admit: 2023-02-04 | Discharge: 2023-02-04 | Disposition: A | Discharge status: Home / Self Care | Payer: BLUE CROSS/BLUE SHIELD | Attending: Emergency Medicine | Admitting: Emergency Medicine

## 2023-02-04 DIAGNOSIS — M5442 Lumbago with sciatica, left side: Secondary | ICD-10-CM | POA: Insufficient documentation

## 2023-02-04 DIAGNOSIS — M549 Dorsalgia, unspecified: Secondary | ICD-10-CM | POA: Diagnosis present

## 2023-02-04 DIAGNOSIS — G8929 Other chronic pain: Secondary | ICD-10-CM | POA: Insufficient documentation

## 2023-02-04 DIAGNOSIS — M5441 Lumbago with sciatica, right side: Secondary | ICD-10-CM | POA: Insufficient documentation

## 2023-02-04 MED ORDER — HYDROCODONE-ACETAMINOPHEN 5-325 MG PO TABS
1.0000 | ORAL_TABLET | Freq: Once | ORAL | Status: AC
  Administered 2023-02-04: 1 via ORAL
  Filled 2023-02-04: qty 1

## 2023-02-04 MED ORDER — KETOROLAC TROMETHAMINE 15 MG/ML IJ SOLN
15.0000 mg | Freq: Once | INTRAMUSCULAR | Status: AC
Start: 2023-02-04 — End: 2023-02-04
  Administered 2023-02-04: 15 mg via INTRAMUSCULAR
  Filled 2023-02-04: qty 1

## 2023-02-04 MED ORDER — LIDOCAINE 5 % EX PTCH
1.0000 | MEDICATED_PATCH | CUTANEOUS | Status: DC
  Administered 2023-02-04: 1 via TRANSDERMAL
  Filled 2023-02-04: qty 1

## 2023-02-04 NOTE — ED Provider Notes (Signed)
[facility] Provider Note   CSN: [id number] Arrival date & time: 02/04/23  1844     History  Chief Complaint  Patient presents with   Back Pain    Nicholas Stafford is a 37 y.o. male with a history of chronic back pain presenting today with back pain.  He was seen 2 days ago and said that the shots he was given only helped for about a day.  No saddle anesthesia, bowel/bladder dysfunction, fevers, chills or IVDU.  Says that he is having trouble sleeping at night   Back Pain      Home Medications Prior to Admission medications   Medication Sig Start Date End Date Taking? Authorizing Provider  gabapentin (NEURONTIN) 100 MG capsule Take 3 capsules (300 mg total) by mouth 3 (three) times daily. 12/28/22   Audley Hose, MD  HYDROcodone-acetaminophen (NORCO/VICODIN) 5-325 MG tablet Take 1 tablet by mouth every 6 (six) hours as needed for severe pain. 02/02/23   Carmin Muskrat, MD  Methyl Salicylate-Lido-Menthol 4-4-5 % PTCH Apply 1 patch topically in the morning and at bedtime. 02/02/23   Carmin Muskrat, MD  pantoprazole (PROTONIX) 20 MG tablet Take 1 tablet (20 mg total) by mouth 2 (two) times daily. 12/07/18   Ward, Ozella Almond, PA-C  predniSONE (DELTASONE) 20 MG tablet Take 2 tablets (40 mg total) by mouth daily with breakfast. For the next four days 02/02/23   Carmin Muskrat, MD      Allergies    Patient has no known allergies.    Review of Systems   Review of Systems  Musculoskeletal:  Positive for back pain.    Physical Exam Updated Vital Signs BP 123/73 (BP Location: Right Arm)   Pulse 75   Temp 98.2 F (36.8 C) (Oral)   Resp 18   Ht 5\' 9"  (1.753 m)   Wt 74.8 kg   SpO2 100%   BMI 24.37 kg/m  Physical Exam Vitals and nursing note reviewed.  Constitutional:      Appearance: Normal appearance.  HENT:     Head: Normocephalic and atraumatic.  Eyes:     General: No scleral icterus.    Conjunctiva/sclera:  Conjunctivae normal.  Cardiovascular:     Heart sounds: No murmur (Strong DP bilaterally) heard. Pulmonary:     Effort: Pulmonary effort is normal. No respiratory distress.  Musculoskeletal:     Comments: Ambulatory to and from the room, midline lower back tenderness  Skin:    General: Skin is warm and dry.     Findings: No rash.  Neurological:     Mental Status: He is alert.  Psychiatric:        Mood and Affect: Mood normal.     ED Results / Procedures / Treatments   Labs (all labs ordered are listed, but only abnormal results are displayed) Labs Reviewed - No data to display  EKG None  Radiology No results found.  Procedures Procedures    Medications Ordered in ED Medications  HYDROcodone-acetaminophen (NORCO/VICODIN) 5-325 MG per tablet 1 tablet (has no administration in time range)  ketorolac (TORADOL) 15 MG/ML injection 15 mg (has no administration in time range)  lidocaine (LIDODERM) 5 % 1 patch (has no administration in time range)    ED Course/ Medical Decision Making/ A&P                             Medical Decision Making  Risk Prescription drug management.   37 year old presenting today with back pain.    The emergent differential diagnosis for back pain includes but is not limited to fracture, muscle strain, cauda equina, spinal stenosis. DDD, ankylosing spondylitis, acute ligamentous injury, disk herniation, spondylolisthesis, Epidural compression syndrome, metastatic cancer, transverse myelitis, vertebral osteomyelitis, diskitis, kidney stone, pyelonephritis, AAA, Perforated ulcer, Retrocecal appendicitis, pancreatitis, bowel obstruction, retroperitoneal hemorrhage or mass, meningitis.  Per chart review patient was seen 2 days ago.  He was given steroid shot, Toradol shot, diclofenac patch and Vicodin.  He was discharged with prednisone and a few tablets of Vicodin.  When I discussed this with the patient with the [place] interpreter he and his wife  say that they were unaware that these medications were sent he currently does not have any red flags of back pain.  He is ambulatory, really low suspicion for cord compression.  He had MRIs in 2023 which showed L4-L5 and L5-S1 disc protrusions.  This is likely the etiology of his pain.  Per external chart review and patient history he is following with orthopedics with [facility].  They want him to get his MRI disc from [facility].  They were instructed to get in touch with medical records.  Treated today with Toradol, Vicodin and lidocaine patch.  Will be discharged and encouraged to pick up the medication that was sent 2 days ago  Final Clinical Impression(s) / ED Diagnoses Final diagnoses:  Chronic midline low back pain with bilateral sciatica    Rx / DC Orders ED Discharge Orders     None      Results and diagnoses were explained to the patient. Return precautions discussed in full. Patient had no additional questions and expressed complete understanding.   This chart was dictated using voice recognition software.  Despite best efforts to proofread,  errors can occur which can change the documentation meaning.    Darliss Ridgel 02/04/23 2008    Valarie Merino, MD 02/04/23 2330

## 2023-02-04 NOTE — Discharge Instructions (Signed)
????? ?????? ??? ??????? ??? ????????? ??????? ???????? ?? ?? ?????? ?????? ?? ??? ?????? ???? ??????? ?????? ?? ? ??? ??? ??????? ?????????? ??????? ???? ??? ???????? ?? ? ???????????? ??? ??????? ???? ??? ?? ?? ?????????? ?????? ?? ??????? ????? ???? ??? ?????? ??????????, ????? ????????? ? ????? ????? ???????? ???? ??????????? ??????? ??????? ??????? ???????? ??? ??????, ?????? ???????????? ??????? ????????? ??????? ???? ????? ?????? ???-?? ?????????? ??????? ???????????? ??? ??????? ???? ?????????? ?? ???? ??? ?????? ????? ??????? ??????????? ????? ????????????????  (  You came to the emergency department with severe back pain.  This is a chronic problem that needs to be followed outpatient.  2 days ago you were sent prednisone which is a steroid and hydrocodone which is a pain medication.  This is a controlled substance so please do not drive with it, drink alcohol with it and keep it out of the reach of others.  As instructed on your previous visit, please follow-up on Monday to obtain your records from medical records.  Continue to follow with your orthopedic but do not hesitate to return with any severely worsening symptoms.)

## 2023-02-04 NOTE — ED Triage Notes (Signed)
Pt complaining of pain that starts in his back and radiates down his legs. Pt was seen here and stated the gabapentin that was given was not letting him sleep at night. They were told its a pinched nerve but nothing is helping. Wife at bedside is wanting to make sure everything gets done today.  Interpreter ipad in room  [place] language
